# Patient Record
Sex: Female | Born: 1965 | Race: White | Hispanic: No | Smoking: Former smoker
Health system: Southern US, Community
[De-identification: ages and names within clinical notes are randomized; demographics above are authoritative.]

## PROBLEM LIST (undated history)

## (undated) DIAGNOSIS — K219 Gastro-esophageal reflux disease without esophagitis: Secondary | ICD-10-CM

## (undated) DIAGNOSIS — I10 Essential (primary) hypertension: Secondary | ICD-10-CM

## (undated) DIAGNOSIS — M1990 Unspecified osteoarthritis, unspecified site: Secondary | ICD-10-CM

## (undated) DIAGNOSIS — M5430 Sciatica, unspecified side: Secondary | ICD-10-CM

## (undated) DIAGNOSIS — R51 Headache: Secondary | ICD-10-CM

## (undated) DIAGNOSIS — E669 Obesity, unspecified: Secondary | ICD-10-CM

## (undated) HISTORY — PX: CHOLECYSTECTOMY: SHX55

## (undated) HISTORY — PX: EYE SURGERY: SHX253

---

## 1992-12-27 HISTORY — PX: TUBAL LIGATION: SHX77

## 2005-12-24 ENCOUNTER — Emergency Department (HOSPITAL_COMMUNITY): Admission: EM | Admit: 2005-12-24 | Discharge: 2005-12-24 | Payer: Self-pay | Admitting: Emergency Medicine

## 2006-05-15 ENCOUNTER — Emergency Department (HOSPITAL_COMMUNITY): Admission: EM | Admit: 2006-05-15 | Discharge: 2006-05-15 | Payer: Self-pay | Admitting: Emergency Medicine

## 2010-05-08 ENCOUNTER — Emergency Department (HOSPITAL_BASED_OUTPATIENT_CLINIC_OR_DEPARTMENT_OTHER): Admission: EM | Admit: 2010-05-08 | Discharge: 2010-05-09 | Payer: Self-pay | Admitting: Emergency Medicine

## 2011-03-16 LAB — URINE MICROSCOPIC-ADD ON

## 2011-03-16 LAB — GLUCOSE, CAPILLARY
Glucose-Capillary: 330 mg/dL — ABNORMAL HIGH (ref 70–99)
Glucose-Capillary: 372 mg/dL — ABNORMAL HIGH (ref 70–99)

## 2011-03-16 LAB — PREGNANCY, URINE: Preg Test, Ur: NEGATIVE

## 2011-03-16 LAB — BASIC METABOLIC PANEL
BUN: 10 mg/dL (ref 6–23)
CO2: 26 mEq/L (ref 19–32)
Chloride: 99 mEq/L (ref 96–112)
Creatinine, Ser: 0.6 mg/dL (ref 0.4–1.2)
Glucose, Bld: 440 mg/dL — ABNORMAL HIGH (ref 70–99)

## 2011-03-16 LAB — DIFFERENTIAL
Basophils Absolute: 0.1 10*3/uL (ref 0.0–0.1)
Basophils Relative: 1 % (ref 0–1)
Eosinophils Absolute: 0.1 10*3/uL (ref 0.0–0.7)
Eosinophils Relative: 1 % (ref 0–5)
Neutrophils Relative %: 77 % (ref 43–77)

## 2011-03-16 LAB — URINALYSIS, ROUTINE W REFLEX MICROSCOPIC
Bilirubin Urine: NEGATIVE
Hgb urine dipstick: NEGATIVE
Nitrite: NEGATIVE
Specific Gravity, Urine: 1.027 (ref 1.005–1.030)
pH: 7.5 (ref 5.0–8.0)

## 2011-03-16 LAB — CBC
HCT: 41.5 % (ref 36.0–46.0)
MCHC: 33 g/dL (ref 30.0–36.0)
MCV: 89.3 fL (ref 78.0–100.0)
Platelets: 197 10*3/uL (ref 150–400)
RDW: 14.4 % (ref 11.5–15.5)
WBC: 12.5 10*3/uL — ABNORMAL HIGH (ref 4.0–10.5)

## 2011-03-16 LAB — KETONES, QUALITATIVE: Acetone, Bld: NEGATIVE

## 2012-07-14 ENCOUNTER — Encounter (HOSPITAL_BASED_OUTPATIENT_CLINIC_OR_DEPARTMENT_OTHER): Payer: Self-pay | Admitting: *Deleted

## 2012-07-14 ENCOUNTER — Emergency Department (HOSPITAL_BASED_OUTPATIENT_CLINIC_OR_DEPARTMENT_OTHER)
Admission: EM | Admit: 2012-07-14 | Discharge: 2012-07-14 | Disposition: A | Discharge status: Home / Self Care | Payer: Medicaid Other | Attending: Emergency Medicine | Admitting: Emergency Medicine

## 2012-07-14 ENCOUNTER — Emergency Department (HOSPITAL_BASED_OUTPATIENT_CLINIC_OR_DEPARTMENT_OTHER): Payer: Medicaid Other

## 2012-07-14 DIAGNOSIS — W2209XA Striking against other stationary object, initial encounter: Secondary | ICD-10-CM | POA: Insufficient documentation

## 2012-07-14 DIAGNOSIS — I10 Essential (primary) hypertension: Secondary | ICD-10-CM | POA: Insufficient documentation

## 2012-07-14 DIAGNOSIS — E119 Type 2 diabetes mellitus without complications: Secondary | ICD-10-CM | POA: Insufficient documentation

## 2012-07-14 DIAGNOSIS — S63509A Unspecified sprain of unspecified wrist, initial encounter: Secondary | ICD-10-CM | POA: Insufficient documentation

## 2012-07-14 HISTORY — DX: Essential (primary) hypertension: I10

## 2012-07-14 MED ORDER — HYDROCODONE-ACETAMINOPHEN 5-500 MG PO TABS: 1.0000 | ORAL_TABLET | Freq: Four times a day (QID) | ORAL | Status: AC | PRN

## 2012-07-14 NOTE — ED Provider Notes (Signed)
History/physical exam/procedure(s) were performed by non-physician practitioner and as supervising physician I was immediately available for consultation/collaboration. I have reviewed all notes and am in agreement with care and plan.   Karmin Kasprzak S Almarosa Bohac, MD 07/14/12 2314 

## 2012-07-14 NOTE — ED Notes (Signed)
Yesterday she hit her left wrist on wooden rail to catch herself from falling.

## 2012-07-14 NOTE — ED Provider Notes (Signed)
History     CSN: [id number]  Arrival date & time 07/14/12  2009   First MD Initiated Contact with Patient 07/14/12 2134      Chief Complaint  Patient presents with  . Wrist Pain    (Consider location/radiation/quality/duration/timing/severity/associated sxs/prior treatment) HPI Comments: Pt states that she was falling yesterday and she hit her wrist on the rail to catch herself and she is continuing to have pain  Patient is a 46 y.o. female presenting with wrist pain. The history is provided by the patient. No language interpreter was used.  Wrist Pain This is a new problem. The current episode started yesterday. The problem occurs constantly. The problem has been unchanged. The symptoms are aggravated by bending. She has tried nothing for the symptoms.    Past Medical History  Diagnosis Date  . Hypertension   . Diabetes mellitus     Past Surgical History  Procedure Date  . Cholecystectomy     No family history on file.  History  Substance Use Topics  . Smoking status: Never Smoker   . Smokeless tobacco: Not on file  . Alcohol Use: No    OB History    Grav Para Term Preterm Abortions TAB SAB Ect Mult Living                  Review of Systems  Constitutional: Negative.   Respiratory: Negative.   Cardiovascular: Negative.     Allergies  Sulfa antibiotics  Home Medications   Current Outpatient Rx  Name Route Sig Dispense Refill  . IBUPROFEN 200 MG PO TABS Oral Take 800 mg by mouth every 6 (six) hours as needed. For pain.    Marland Kitchen LISINOPRIL-HYDROCHLOROTHIAZIDE 20-12.5 MG PO TABS Oral Take 1 tablet by mouth daily.    Marland Kitchen METFORMIN HCL 1000 MG PO TABS Oral Take 1,000 mg by mouth 2 (two) times daily with a meal.    . OMEPRAZOLE 10 MG PO CPDR Oral Take 10 mg by mouth daily.    Marland Kitchen HYDROCODONE-ACETAMINOPHEN 5-500 MG PO TABS Oral Take 1-2 tablets by mouth every 6 (six) hours as needed for pain. 10 tablet 0    BP 129/86  Pulse 94  Temp 98 F (36.7 C) (Oral)  Resp  20  SpO2 99%  LMP 07/06/2012  Physical Exam  Nursing note and vitals reviewed. Constitutional: She is oriented to person, place, and time. She appears well-developed and well-nourished.  Cardiovascular: Normal rate and regular rhythm.   Pulmonary/Chest: Breath sounds normal.  Musculoskeletal: Normal range of motion.       Pt has generalized swelling to the left wrist:pt has full WUJ:WJXBJY intact  Neurological: She is alert and oriented to person, place, and time. Coordination normal.  Skin: Skin is warm and dry.    ED Course  Procedures (including critical care time)  Labs Reviewed - No data to display Dg Wrist Complete Left  07/14/2012  *RADIOLOGY REPORT*  Clinical Data: History of fall complaining of left wrist pain and swelling.  LEFT WRIST - COMPLETE 3+ VIEW  Comparison: No priors.  Findings: Four views of the left wrist demonstrate no acute fracture, subluxation or dislocation.  Extensive soft tissue swelling is noted around the wrist joint.  IMPRESSION: 1.  Soft tissue swelling around the left wrist joint, without evidence of underlying bony trauma.  Original Report Authenticated By: Florencia Reasons, M.D.     1. Wrist sprain       MDM  Pt placed in a sling  for comfort and given vicodin for pain        Teressa Lower, NP 07/14/12 2156

## 2012-08-18 ENCOUNTER — Encounter (HOSPITAL_BASED_OUTPATIENT_CLINIC_OR_DEPARTMENT_OTHER): Payer: Self-pay | Admitting: Emergency Medicine

## 2012-08-18 ENCOUNTER — Emergency Department (HOSPITAL_BASED_OUTPATIENT_CLINIC_OR_DEPARTMENT_OTHER)
Admission: EM | Admit: 2012-08-18 | Discharge: 2012-08-18 | Disposition: A | Discharge status: Home / Self Care | Payer: 59 | Attending: Emergency Medicine | Admitting: Emergency Medicine

## 2012-08-18 ENCOUNTER — Emergency Department (HOSPITAL_BASED_OUTPATIENT_CLINIC_OR_DEPARTMENT_OTHER): Payer: 59

## 2012-08-18 ENCOUNTER — Other Ambulatory Visit (HOSPITAL_BASED_OUTPATIENT_CLINIC_OR_DEPARTMENT_OTHER): Payer: Medicaid Other

## 2012-08-18 DIAGNOSIS — N739 Female pelvic inflammatory disease, unspecified: Secondary | ICD-10-CM | POA: Insufficient documentation

## 2012-08-18 DIAGNOSIS — Z882 Allergy status to sulfonamides status: Secondary | ICD-10-CM | POA: Insufficient documentation

## 2012-08-18 DIAGNOSIS — N760 Acute vaginitis: Secondary | ICD-10-CM | POA: Insufficient documentation

## 2012-08-18 DIAGNOSIS — A499 Bacterial infection, unspecified: Secondary | ICD-10-CM | POA: Insufficient documentation

## 2012-08-18 DIAGNOSIS — I10 Essential (primary) hypertension: Secondary | ICD-10-CM | POA: Insufficient documentation

## 2012-08-18 DIAGNOSIS — E669 Obesity, unspecified: Secondary | ICD-10-CM | POA: Insufficient documentation

## 2012-08-18 DIAGNOSIS — B9689 Other specified bacterial agents as the cause of diseases classified elsewhere: Secondary | ICD-10-CM | POA: Insufficient documentation

## 2012-08-18 DIAGNOSIS — K219 Gastro-esophageal reflux disease without esophagitis: Secondary | ICD-10-CM | POA: Insufficient documentation

## 2012-08-18 DIAGNOSIS — E119 Type 2 diabetes mellitus without complications: Secondary | ICD-10-CM | POA: Insufficient documentation

## 2012-08-18 HISTORY — DX: Obesity, unspecified: E66.9

## 2012-08-18 HISTORY — DX: Gastro-esophageal reflux disease without esophagitis: K21.9

## 2012-08-18 LAB — URINALYSIS, ROUTINE W REFLEX MICROSCOPIC
Glucose, UA: >1000 mg/dL — AB
Hgb urine dipstick: NEGATIVE
Ketones, ur: NEGATIVE mg/dL
Leukocytes, UA: NEGATIVE
Protein, ur: NEGATIVE mg/dL
pH: 5.5 (ref 5.0–8.0)

## 2012-08-18 LAB — WET PREP, GENITAL
Trich, Wet Prep: NONE SEEN
Yeast Wet Prep HPF POC: NONE SEEN

## 2012-08-18 MED ORDER — LIDOCAINE HCL (PF) 1 % IJ SOLN
INTRAMUSCULAR | Status: AC
  Administered 2012-08-18: 5 mL
  Filled 2012-08-18: qty 5

## 2012-08-18 MED ORDER — METRONIDAZOLE 500 MG PO TABS
2000.0000 mg | ORAL_TABLET | Freq: Once | ORAL | Status: AC
  Administered 2012-08-18: 2000 mg via ORAL
  Filled 2012-08-18: qty 4

## 2012-08-18 MED ORDER — DOXYCYCLINE HYCLATE 100 MG PO TABS
100.0000 mg | ORAL_TABLET | Freq: Once | ORAL | Status: AC
  Administered 2012-08-18: 100 mg via ORAL
  Filled 2012-08-18: qty 1

## 2012-08-18 MED ORDER — DOXYCYCLINE HYCLATE 100 MG PO CAPS: 100.0000 mg | ORAL_CAPSULE | Freq: Two times a day (BID) | ORAL | Status: AC

## 2012-08-18 MED ORDER — CEFTRIAXONE SODIUM 250 MG IJ SOLR
250.0000 mg | Freq: Once | INTRAMUSCULAR | Status: AC
  Administered 2012-08-18: 250 mg via INTRAMUSCULAR
  Filled 2012-08-18: qty 250

## 2012-08-18 NOTE — ED Provider Notes (Signed)
History     CSN: [id number]  Arrival date & time 08/18/12  1916   First MD Initiated Contact with Patient 08/18/12 2024      Chief Complaint  Patient presents with  . Vaginal Discharge  . Vaginal Itching    (Consider location/radiation/quality/duration/timing/severity/associated sxs/prior treatment) HPI Comments: Patient reports 3 weeks of white vaginal discharge unrelieved by Monistat. She complains of itching with urination and vaginal pain. She denies any nausea, vomiting, fevers or chills. She complains of intermittent "ovary pain" over the past year it comes and goes. She denies any back pain, chest pain shortness of breath. She denies any vaginal bleeding.  The history is provided by the patient.    Past Medical History  Diagnosis Date  . Hypertension   . Diabetes mellitus   . GERD (gastroesophageal reflux disease)   . Obesity     Past Surgical History  Procedure Date  . Cholecystectomy     No family history on file.  History  Substance Use Topics  . Smoking status: Never Smoker   . Smokeless tobacco: Not on file  . Alcohol Use: No    OB History    Grav Para Term Preterm Abortions TAB SAB Ect Mult Living                  Review of Systems  Constitutional: Negative for fever, activity change and appetite change.  HENT: Negative for congestion and rhinorrhea.   Respiratory: Negative for cough, chest tightness and shortness of breath.   Cardiovascular: Negative for chest pain.  Gastrointestinal: Positive for abdominal pain. Negative for nausea and vomiting.  Genitourinary: Positive for vaginal discharge. Negative for dysuria, hematuria and vaginal bleeding.  Musculoskeletal: Negative for back pain.  Skin: Negative for wound.  Neurological: Negative for dizziness, tremors, weakness and headaches.    Allergies  Sulfa antibiotics  Home Medications   Current Outpatient Rx  Name Route Sig Dispense Refill  . ALBUTEROL SULFATE HFA 108 (90 BASE) MCG/ACT  IN AERS Inhalation Inhale 2 puffs into the lungs every 6 (six) hours as needed. For shortness of breath    . DIPHENHYDRAMINE-APAP (SLEEP) 25-500 MG PO TABS Oral Take 2 tablets by mouth at bedtime as needed. For pain    . LISINOPRIL-HYDROCHLOROTHIAZIDE 20-12.5 MG PO TABS Oral Take 1 tablet by mouth daily.    Marland Kitchen METFORMIN HCL 1000 MG PO TABS Oral Take 1,000 mg by mouth 2 (two) times daily with a meal.    . OMEPRAZOLE 10 MG PO CPDR Oral Take 10 mg by mouth daily.    Marland Kitchen DOXYCYCLINE HYCLATE 100 MG PO CAPS Oral Take 1 capsule (100 mg total) by mouth 2 (two) times daily. 20 capsule 0    BP 146/100  Pulse 78  Temp 98 F (36.7 C) (Oral)  Resp 18  Ht 5\' 5"  (1.651 m)  Wt 224 lb (101.606 kg)  BMI 37.28 kg/m2  SpO2 99%  LMP 07/31/2012  Physical Exam  Constitutional: She is oriented to person, place, and time. She appears well-developed and well-nourished. No distress.  HENT:  Head: Normocephalic and atraumatic.  Mouth/Throat: Oropharynx is clear and moist. No oropharyngeal exudate.  Eyes: Conjunctivae and EOM are normal. Pupils are equal, round, and reactive to light.  Neck: Normal range of motion. Neck supple.  Cardiovascular: Normal rate, regular rhythm and normal heart sounds.   No murmur heard. Pulmonary/Chest: Effort normal and breath sounds normal. No respiratory distress.  Abdominal: Soft. There is tenderness. There is no rebound and  no guarding.       Mild suprapubic tenderness  Genitourinary: Cervix exhibits motion tenderness and discharge. Right adnexum displays no mass and no tenderness. Left adnexum displays no mass and no tenderness. Vaginal discharge found.       Mild CMT and bilateral adnexal tenderness. White discharge with cervix.  Musculoskeletal: Normal range of motion. She exhibits no edema and no tenderness.  Neurological: She is alert and oriented to person, place, and time. No cranial nerve deficit.  Skin: Skin is warm.    ED Course  Procedures (including critical care  time)  Labs Reviewed  URINALYSIS, ROUTINE W REFLEX MICROSCOPIC - Abnormal; Notable for the following:    Specific Gravity, Urine 1.043 (*)     Glucose, UA >1000 (*)     Bilirubin Urine SMALL (*)     All other components within normal limits  WET PREP, GENITAL - Abnormal; Notable for the following:    Clue Cells Wet Prep HPF POC FEW (*)     WBC, Wet Prep HPF POC FEW (*)     All other components within normal limits  URINE MICROSCOPIC-ADD ON  GC/CHLAMYDIA PROBE AMP, GENITAL   No results found.   1. Pelvic inflammatory disease   2. Bacterial vaginosis       MDM  Vaginal discharge for 3 weeks with lower abdominal pain. No vomiting or fever  Mild suprapubic and exam. No lateralizing adnexal tenderness. No yeast visualized. Given cervical motion tenderness treated empirically for PID. Treated for bacterial vaginosis as well.  Plan to pursue ultrasound given patient's complaint of "ovary pain", but she did not want to wait. Doubt ovarian torsion or TOA. She is not having any pain currently and states she'll followup with her doctor for this.      Glynn Octave, MD 08/18/12 2150

## 2012-08-18 NOTE — ED Notes (Signed)
Pt reports white vaginal discharge x3 weeks.  Has used OTC Monistat more than once with no improvement in symptoms.  Also pt is having bil "ovary" pain that comes and goes.  Has been seen at [facility] for it before but it "keeps coming back."  Sts she is not able to sleep due to the pain.

## 2012-08-18 NOTE — ED Notes (Signed)
Pt c/o itching really bad.Has been seen by MD in [place]

## 2012-08-19 LAB — GC/CHLAMYDIA PROBE AMP, GENITAL
Chlamydia, DNA Probe: NEGATIVE
GC Probe Amp, Genital: NEGATIVE

## 2012-11-04 ENCOUNTER — Encounter (HOSPITAL_BASED_OUTPATIENT_CLINIC_OR_DEPARTMENT_OTHER): Payer: Self-pay

## 2012-11-04 ENCOUNTER — Emergency Department (HOSPITAL_BASED_OUTPATIENT_CLINIC_OR_DEPARTMENT_OTHER)
Admission: EM | Admit: 2012-11-04 | Discharge: 2012-11-04 | Disposition: A | Discharge status: Home / Self Care | Payer: Medicaid Other | Attending: Emergency Medicine | Admitting: Emergency Medicine

## 2012-11-04 DIAGNOSIS — R1110 Vomiting, unspecified: Secondary | ICD-10-CM

## 2012-11-04 DIAGNOSIS — H53149 Visual discomfort, unspecified: Secondary | ICD-10-CM | POA: Insufficient documentation

## 2012-11-04 DIAGNOSIS — R112 Nausea with vomiting, unspecified: Secondary | ICD-10-CM | POA: Insufficient documentation

## 2012-11-04 DIAGNOSIS — R51 Headache: Secondary | ICD-10-CM | POA: Insufficient documentation

## 2012-11-04 DIAGNOSIS — R739 Hyperglycemia, unspecified: Secondary | ICD-10-CM

## 2012-11-04 DIAGNOSIS — E669 Obesity, unspecified: Secondary | ICD-10-CM | POA: Insufficient documentation

## 2012-11-04 DIAGNOSIS — K219 Gastro-esophageal reflux disease without esophagitis: Secondary | ICD-10-CM | POA: Insufficient documentation

## 2012-11-04 DIAGNOSIS — I10 Essential (primary) hypertension: Secondary | ICD-10-CM | POA: Insufficient documentation

## 2012-11-04 DIAGNOSIS — E860 Dehydration: Secondary | ICD-10-CM | POA: Insufficient documentation

## 2012-11-04 DIAGNOSIS — E1169 Type 2 diabetes mellitus with other specified complication: Secondary | ICD-10-CM | POA: Insufficient documentation

## 2012-11-04 LAB — URINALYSIS, ROUTINE W REFLEX MICROSCOPIC
Glucose, UA: >1000 mg/dL — AB
Hgb urine dipstick: NEGATIVE
Leukocytes, UA: NEGATIVE
Protein, ur: 100 mg/dL — AB
Specific Gravity, Urine: 1.039 — ABNORMAL HIGH (ref 1.005–1.030)
Urobilinogen, UA: 0.2 mg/dL (ref 0.0–1.0)

## 2012-11-04 LAB — GLUCOSE, CAPILLARY: Glucose-Capillary: 256 mg/dL — ABNORMAL HIGH (ref 70–99)

## 2012-11-04 LAB — COMPREHENSIVE METABOLIC PANEL
ALT: 12 U/L (ref 0–35)
BUN: 10 mg/dL (ref 6–23)
CO2: 25 mEq/L (ref 19–32)
Calcium: 9.8 mg/dL (ref 8.4–10.5)
Creatinine, Ser: 0.6 mg/dL (ref 0.50–1.10)
GFR calc Af Amer: >90 mL/min (ref >90)
GFR calc non Af Amer: >90 mL/min (ref >90)
Glucose, Bld: 284 mg/dL — ABNORMAL HIGH (ref 70–99)
Sodium: 134 mEq/L — ABNORMAL LOW (ref 135–145)
Total Protein: 7.7 g/dL (ref 6.0–8.3)

## 2012-11-04 LAB — LIPASE, BLOOD: Lipase: 27 U/L (ref 11–59)

## 2012-11-04 LAB — CBC WITH DIFFERENTIAL/PLATELET
Eosinophils Absolute: 0 10*3/uL (ref 0.0–0.7)
Eosinophils Relative: 0 % (ref 0–5)
HCT: 41 % (ref 36.0–46.0)
Hemoglobin: 13.9 g/dL (ref 12.0–15.0)
Lymphocytes Relative: 18 % (ref 12–46)
Lymphs Abs: 1.8 10*3/uL (ref 0.7–4.0)
MCH: 28.6 pg (ref 26.0–34.0)
MCV: 84.4 fL (ref 78.0–100.0)
Monocytes Absolute: 0.7 10*3/uL (ref 0.1–1.0)
Monocytes Relative: 7 % (ref 3–12)
Platelets: 213 10*3/uL (ref 150–400)
RBC: 4.86 MIL/uL (ref 3.87–5.11)
WBC: 9.9 10*3/uL (ref 4.0–10.5)

## 2012-11-04 LAB — PREGNANCY, URINE: Preg Test, Ur: NEGATIVE

## 2012-11-04 LAB — URINE MICROSCOPIC-ADD ON

## 2012-11-04 MED ORDER — HYDROMORPHONE HCL PF 1 MG/ML IJ SOLN
1.0000 mg | Freq: Once | INTRAMUSCULAR | Status: AC
  Administered 2012-11-04: 1 mg via INTRAVENOUS
  Filled 2012-11-04: qty 1

## 2012-11-04 MED ORDER — METOCLOPRAMIDE HCL 5 MG/ML IJ SOLN
INTRAMUSCULAR | Status: AC
  Administered 2012-11-04: 10 mg via INTRAVENOUS
  Filled 2012-11-04: qty 2

## 2012-11-04 MED ORDER — METOCLOPRAMIDE HCL 5 MG/ML IJ SOLN
10.0000 mg | Freq: Once | INTRAMUSCULAR | Status: AC
  Administered 2012-11-04: 10 mg via INTRAVENOUS

## 2012-11-04 MED ORDER — ONDANSETRON HCL 4 MG/2ML IJ SOLN
4.0000 mg | Freq: Once | INTRAMUSCULAR | Status: AC
  Administered 2012-11-04: 4 mg via INTRAVENOUS
  Filled 2012-11-04: qty 2

## 2012-11-04 MED ORDER — SODIUM CHLORIDE 0.9 % IV BOLUS (SEPSIS)
1000.0000 mL | Freq: Once | INTRAVENOUS | Status: AC
  Administered 2012-11-04: 1000 mL via INTRAVENOUS

## 2012-11-04 MED ORDER — METOCLOPRAMIDE HCL 10 MG PO TABS: 10.0000 mg | ORAL_TABLET | Freq: Four times a day (QID) | ORAL | Status: DC | PRN

## 2012-11-04 NOTE — ED Notes (Signed)
CBG 256  °

## 2012-11-04 NOTE — ED Provider Notes (Signed)
History     CSN: [id number]  Arrival date & time 11/04/12  1900   First MD Initiated Contact with Patient 11/04/12 2013      Chief Complaint  Patient presents with  . headache, nausea     (Consider location/radiation/quality/duration/timing/severity/associated sxs/prior treatment) HPI Pt with history of DM reports onset of nausea and vomiting yesterday afternoon, multiple episodes without blood. A few hours after onset she began to have diffuse throbbing, frontal headache, associated with photophobia and continued vomiting. Denies any abdominal pain, no fever. Has had general malaise.   Past Medical History  Diagnosis Date  . Hypertension   . Diabetes mellitus   . GERD (gastroesophageal reflux disease)   . Obesity     Past Surgical History  Procedure Date  . Cholecystectomy     No family history on file.  History  Substance Use Topics  . Smoking status: Never Smoker   . Smokeless tobacco: Not on file  . Alcohol Use: No    OB History    Grav Para Term Preterm Abortions TAB SAB Ect Mult Living                  Review of Systems All other systems reviewed and are negative except as noted in HPI.   Allergies  Sulfa antibiotics  Home Medications   Current Outpatient Rx  Name  Route  Sig  Dispense  Refill  . ALBUTEROL SULFATE HFA 108 (90 BASE) MCG/ACT IN AERS   Inhalation   Inhale 2 puffs into the lungs every 6 (six) hours as needed. For shortness of breath         . DIPHENHYDRAMINE-APAP (SLEEP) 25-500 MG PO TABS   Oral   Take 2 tablets by mouth at bedtime as needed. For pain         . LISINOPRIL-HYDROCHLOROTHIAZIDE 20-12.5 MG PO TABS   Oral   Take 1 tablet by mouth daily.         Marland Kitchen METFORMIN HCL 1000 MG PO TABS   Oral   Take 1,000 mg by mouth 2 (two) times daily with a meal.         . OMEPRAZOLE 10 MG PO CPDR   Oral   Take 10 mg by mouth daily.           BP 139/92  Pulse 92  Temp 98.9 F (37.2 C) (Oral)  Resp 18  Ht 5\' 5"  (1.651  m)  Wt 220 lb (99.791 kg)  BMI 36.61 kg/m2  SpO2 100%  Physical Exam  Nursing note and vitals reviewed. Constitutional: She is oriented to person, place, and time. She appears well-developed and well-nourished.  HENT:  Head: Normocephalic and atraumatic.       Dry mouth  Eyes: EOM are normal. Pupils are equal, round, and reactive to light.  Neck: Normal range of motion. Neck supple.  Cardiovascular: Normal rate, normal heart sounds and intact distal pulses.   Pulmonary/Chest: Effort normal and breath sounds normal.  Abdominal: Bowel sounds are normal. She exhibits no distension. There is no tenderness.  Musculoskeletal: Normal range of motion. She exhibits no edema and no tenderness.  Neurological: She is alert and oriented to person, place, and time. She has normal strength. No cranial nerve deficit or sensory deficit.  Skin: Skin is warm and dry. No rash noted.  Psychiatric: She has a normal mood and affect.    ED Course  Procedures (including critical care time)  Labs Reviewed  URINALYSIS, ROUTINE W REFLEX  MICROSCOPIC - Abnormal; Notable for the following:    APPearance CLOUDY (*)     Specific Gravity, Urine 1.039 (*)     Glucose, UA >1000 (*)     Ketones, ur >80 (*)     Protein, ur 100 (*)     All other components within normal limits  URINE MICROSCOPIC-ADD ON - Abnormal; Notable for the following:    Squamous Epithelial / LPF MANY (*)     Bacteria, UA MANY (*)     All other components within normal limits  GLUCOSE, CAPILLARY - Abnormal; Notable for the following:    Glucose-Capillary 256 (*)     All other components within normal limits  COMPREHENSIVE METABOLIC PANEL - Abnormal; Notable for the following:    Sodium 134 (*)     Chloride 95 (*)     Glucose, Bld 284 (*)     All other components within normal limits  PREGNANCY, URINE  CBC WITH DIFFERENTIAL  LIPASE, BLOOD   No results found.   No diagnosis found.    MDM  UA concerning for dehydration and/or  DKA, labs sent, IVF, nausea and pain meds ordered.   11:21 PM Pt feeling better, IVF done. Will give PO trial and anticipate discharge. Consider gastroparesis given history of DM. Headache likely due to dehydration/vomiting, doubt primary intracranial process.       Charles B. Bernette Mayers, MD 11/04/12 2326

## 2012-11-04 NOTE — ED Notes (Signed)
D/c with RX x 1 for reglan- family present to drive

## 2012-11-04 NOTE — ED Notes (Signed)
MD at bedside. 

## 2012-11-04 NOTE — ED Notes (Signed)
Patient complains of generalized headache that started last pm, reports nausea, positive photophobia. Alert and oriented.

## 2012-12-08 ENCOUNTER — Emergency Department (HOSPITAL_BASED_OUTPATIENT_CLINIC_OR_DEPARTMENT_OTHER)
Admission: EM | Admit: 2012-12-08 | Discharge: 2012-12-08 | Disposition: A | Discharge status: Home / Self Care | Payer: Medicaid Other | Attending: Emergency Medicine | Admitting: Emergency Medicine

## 2012-12-08 ENCOUNTER — Encounter (HOSPITAL_BASED_OUTPATIENT_CLINIC_OR_DEPARTMENT_OTHER): Payer: Self-pay | Admitting: Respiratory Therapy

## 2012-12-08 ENCOUNTER — Emergency Department (HOSPITAL_BASED_OUTPATIENT_CLINIC_OR_DEPARTMENT_OTHER): Payer: Medicaid Other

## 2012-12-08 DIAGNOSIS — Y9301 Activity, walking, marching and hiking: Secondary | ICD-10-CM | POA: Insufficient documentation

## 2012-12-08 DIAGNOSIS — IMO0001 Reserved for inherently not codable concepts without codable children: Secondary | ICD-10-CM | POA: Insufficient documentation

## 2012-12-08 DIAGNOSIS — E669 Obesity, unspecified: Secondary | ICD-10-CM | POA: Insufficient documentation

## 2012-12-08 DIAGNOSIS — M25469 Effusion, unspecified knee: Secondary | ICD-10-CM | POA: Insufficient documentation

## 2012-12-08 DIAGNOSIS — X500XXA Overexertion from strenuous movement or load, initial encounter: Secondary | ICD-10-CM | POA: Insufficient documentation

## 2012-12-08 DIAGNOSIS — Y929 Unspecified place or not applicable: Secondary | ICD-10-CM | POA: Insufficient documentation

## 2012-12-08 DIAGNOSIS — E119 Type 2 diabetes mellitus without complications: Secondary | ICD-10-CM | POA: Insufficient documentation

## 2012-12-08 DIAGNOSIS — I10 Essential (primary) hypertension: Secondary | ICD-10-CM | POA: Insufficient documentation

## 2012-12-08 DIAGNOSIS — Z87891 Personal history of nicotine dependence: Secondary | ICD-10-CM | POA: Insufficient documentation

## 2012-12-08 DIAGNOSIS — M25462 Effusion, left knee: Secondary | ICD-10-CM

## 2012-12-08 DIAGNOSIS — K219 Gastro-esophageal reflux disease without esophagitis: Secondary | ICD-10-CM | POA: Insufficient documentation

## 2012-12-08 DIAGNOSIS — Z79899 Other long term (current) drug therapy: Secondary | ICD-10-CM | POA: Insufficient documentation

## 2012-12-08 MED ORDER — OXYCODONE-ACETAMINOPHEN 5-325 MG PO TABS
2.0000 | ORAL_TABLET | Freq: Once | ORAL | Status: AC
  Administered 2012-12-08: 2 via ORAL
  Filled 2012-12-08 (×2): qty 2

## 2012-12-08 MED ORDER — OXYCODONE-ACETAMINOPHEN 5-325 MG PO TABS: 2.0000 | ORAL_TABLET | ORAL | Status: DC | PRN

## 2012-12-08 NOTE — ED Provider Notes (Signed)
Medical screening examination/treatment/procedure(s) were performed by non-physician practitioner and as supervising physician I was immediately available for consultation/collaboration.   Gwyneth Sprout, MD 12/08/12 1416

## 2012-12-08 NOTE — ED Provider Notes (Signed)
History     CSN: [id number]  Arrival date & time 12/08/12  1231   First MD Initiated Contact with Patient 12/08/12 1242      Chief Complaint  Patient presents with  . Knee Pain    left    (Consider location/radiation/quality/duration/timing/severity/associated sxs/prior treatment) Patient is a 46 y.o. female presenting with knee pain. The history is provided by the patient. No language interpreter was used.  Knee Pain This is a new problem. The current episode started today. The problem occurs constantly. The problem has been gradually worsening. Associated symptoms include joint swelling and myalgias. Associated symptoms comments: swelling. Nothing aggravates the symptoms. She has tried nothing for the symptoms. The treatment provided moderate relief.  Pt complains of swelling to her left knee.   Pt reports knee pulled and popped 3 days ago while walking  Past Medical History  Diagnosis Date  . Hypertension   . Diabetes mellitus   . GERD (gastroesophageal reflux disease)   . Obesity     Past Surgical History  Procedure Date  . Cholecystectomy     No family history on file.  History  Substance Use Topics  . Smoking status: Former Smoker    Quit date: 12/27/2009  . Smokeless tobacco: Not on file  . Alcohol Use: No    OB History    Grav Para Term Preterm Abortions TAB SAB Ect Mult Living                  Review of Systems  Musculoskeletal: Positive for myalgias and joint swelling.  All other systems reviewed and are negative.    Allergies  Sulfa antibiotics  Home Medications   Current Outpatient Rx  Name  Route  Sig  Dispense  Refill  . ALBUTEROL SULFATE HFA 108 (90 BASE) MCG/ACT IN AERS   Inhalation   Inhale 2 puffs into the lungs every 6 (six) hours as needed. For shortness of breath         . DIPHENHYDRAMINE-APAP (SLEEP) 25-500 MG PO TABS   Oral   Take 2 tablets by mouth at bedtime as needed. For pain         .  LISINOPRIL-HYDROCHLOROTHIAZIDE 20-12.5 MG PO TABS   Oral   Take 1 tablet by mouth daily.         Marland Kitchen METFORMIN HCL 1000 MG PO TABS   Oral   Take 1,000 mg by mouth 2 (two) times daily with a meal.         . METOCLOPRAMIDE HCL 10 MG PO TABS   Oral   Take 1 tablet (10 mg total) by mouth 4 (four) times daily as needed. For nausea   30 tablet   0   . OMEPRAZOLE 10 MG PO CPDR   Oral   Take 10 mg by mouth daily.           BP 122/85  Pulse 96  Temp 97.9 F (36.6 C)  Resp 18  Ht 5\' 5"  (1.651 m)  Wt 220 lb (99.791 kg)  BMI 36.61 kg/m2  SpO2 100%  LMP 12/05/2012  Physical Exam  Nursing note and vitals reviewed. Constitutional: She is oriented to person, place, and time. She appears well-developed and well-nourished.  HENT:  Head: Normocephalic and atraumatic.  Musculoskeletal: She exhibits tenderness.       Tender left knee swollen,  Pain with palpation,  nv and ns intact  Neurological: She is alert and oriented to person, place, and time. She has normal  reflexes.  Skin: Skin is warm.  Psychiatric: She has a normal mood and affect.    ED Course  Procedures (including critical care time)  Labs Reviewed - No data to display Dg Knee Complete 4 Views Left  12/08/2012  *RADIOLOGY REPORT*  Clinical Data: Pain and swelling post fall  LEFT KNEE - COMPLETE 4+ VIEW  Comparison: None  Findings: Four views of the left knee submitted.  No acute fracture or subluxation. Small joint effusion.  Narrowing of patellofemoral joint space. There is mild narrowing of medial joint compartment.  Spurring of medial femoral condyle and  medial tibial plateau.  Mild chondrocalcinosis lateral compartment.  IMPRESSION: No acute fracture or subluxation.  Small joint effusion. Degenerative changes as described above.   Original Report Authenticated By: Natasha Mead, M.D.      No diagnosis found.    MDM  Knee imbolizer and crutches.   Pt advised to see Dr. Janee Morn for evaluation of knee  Rx for  percocet        Lonia Skinner [place], [place] 12/08/12 1351

## 2012-12-08 NOTE — ED Notes (Signed)
Patient states she was getting out of bed yesterday and when she stepped down on her left leg, she felt a "pop" in the posterior left knee.  States she had pain all day yesterday, and this morning while getting out of bed, she stepped onto her left leg which caused her to have intense pain in the left knee and fell onto her left knee.  Denies any other injuries.

## 2012-12-18 ENCOUNTER — Encounter (HOSPITAL_BASED_OUTPATIENT_CLINIC_OR_DEPARTMENT_OTHER): Payer: Self-pay | Admitting: *Deleted

## 2012-12-18 ENCOUNTER — Emergency Department (HOSPITAL_BASED_OUTPATIENT_CLINIC_OR_DEPARTMENT_OTHER)
Admission: EM | Admit: 2012-12-18 | Discharge: 2012-12-18 | Disposition: A | Discharge status: Home / Self Care | Payer: Medicaid Other | Attending: Emergency Medicine | Admitting: Emergency Medicine

## 2012-12-18 DIAGNOSIS — M25569 Pain in unspecified knee: Secondary | ICD-10-CM | POA: Insufficient documentation

## 2012-12-18 DIAGNOSIS — I10 Essential (primary) hypertension: Secondary | ICD-10-CM | POA: Insufficient documentation

## 2012-12-18 DIAGNOSIS — M25562 Pain in left knee: Secondary | ICD-10-CM

## 2012-12-18 DIAGNOSIS — Z79899 Other long term (current) drug therapy: Secondary | ICD-10-CM | POA: Insufficient documentation

## 2012-12-18 DIAGNOSIS — E119 Type 2 diabetes mellitus without complications: Secondary | ICD-10-CM | POA: Insufficient documentation

## 2012-12-18 DIAGNOSIS — Z87891 Personal history of nicotine dependence: Secondary | ICD-10-CM | POA: Insufficient documentation

## 2012-12-18 DIAGNOSIS — IMO0001 Reserved for inherently not codable concepts without codable children: Secondary | ICD-10-CM | POA: Insufficient documentation

## 2012-12-18 DIAGNOSIS — E669 Obesity, unspecified: Secondary | ICD-10-CM | POA: Insufficient documentation

## 2012-12-18 DIAGNOSIS — K219 Gastro-esophageal reflux disease without esophagitis: Secondary | ICD-10-CM | POA: Insufficient documentation

## 2012-12-18 DIAGNOSIS — M25469 Effusion, unspecified knee: Secondary | ICD-10-CM | POA: Insufficient documentation

## 2012-12-18 MED ORDER — OXYCODONE-ACETAMINOPHEN 5-325 MG PO TABS: 2.0000 | ORAL_TABLET | ORAL | Status: DC | PRN

## 2012-12-18 NOTE — ED Provider Notes (Signed)
Medical screening examination/treatment/procedure(s) were performed by non-physician practitioner and as supervising physician I was immediately available for consultation/collaboration.  Jhovanny Guinta, MD 12/18/12 2240 

## 2012-12-18 NOTE — ED Provider Notes (Signed)
History     CSN: [id number]  Arrival date & time 12/18/12  1144   First MD Initiated Contact with Patient 12/18/12 1202      Chief Complaint  Patient presents with  . Knee Pain    (Consider location/radiation/quality/duration/timing/severity/associated sxs/prior treatment) Patient is a 46 y.o. female presenting with knee pain. The history is provided by the patient.  Knee Pain This is a new problem. The current episode started 1 to 4 weeks ago. The problem occurs constantly. The problem has been gradually worsening. Associated symptoms include joint swelling and myalgias. Associated symptoms comments: swelling. The symptoms are aggravated by bending. She has tried nothing for the symptoms. The treatment provided no relief.  Pt seen here by me on 12/13 for knee injury with effusion.   Pt can not see Orthopaedist until next week.   Pt still swollen and painful.   Pt is requesting a knee sleeve  Past Medical History  Diagnosis Date  . Hypertension   . Diabetes mellitus   . GERD (gastroesophageal reflux disease)   . Obesity     Past Surgical History  Procedure Date  . Cholecystectomy     No family history on file.  History  Substance Use Topics  . Smoking status: Former Smoker    Quit date: 12/27/2009  . Smokeless tobacco: Not on file  . Alcohol Use: No    OB History    Grav Para Term Preterm Abortions TAB SAB Ect Mult Living                  Review of Systems  Musculoskeletal: Positive for myalgias and joint swelling.  All other systems reviewed and are negative.    Allergies  Sulfa antibiotics  Home Medications   Current Outpatient Rx  Name  Route  Sig  Dispense  Refill  . ALBUTEROL SULFATE HFA 108 (90 BASE) MCG/ACT IN AERS   Inhalation   Inhale 2 puffs into the lungs every 6 (six) hours as needed. For shortness of breath         . DIPHENHYDRAMINE-APAP (SLEEP) 25-500 MG PO TABS   Oral   Take 2 tablets by mouth at bedtime as needed. For pain          . LISINOPRIL-HYDROCHLOROTHIAZIDE 20-12.5 MG PO TABS   Oral   Take 1 tablet by mouth daily.         Marland Kitchen METFORMIN HCL 1000 MG PO TABS   Oral   Take 1,000 mg by mouth 2 (two) times daily with a meal.         . METOCLOPRAMIDE HCL 10 MG PO TABS   Oral   Take 1 tablet (10 mg total) by mouth 4 (four) times daily as needed. For nausea   30 tablet   0   . OMEPRAZOLE 10 MG PO CPDR   Oral   Take 10 mg by mouth daily.         . OXYCODONE-ACETAMINOPHEN 5-325 MG PO TABS   Oral   Take 2 tablets by mouth every 4 (four) hours as needed for pain.   16 tablet   0     BP 113/79  Pulse 101  Temp 98.6 F (37 C) (Oral)  Resp 18  Ht 5\' 5"  (1.651 m)  Wt 220 lb (99.791 kg)  BMI 36.61 kg/m2  SpO2 96%  LMP 12/05/2012  Physical Exam  Nursing note and vitals reviewed. Constitutional: She appears well-developed and well-nourished.  HENT:  Head: Normocephalic.  Cardiovascular:  Normal rate.   Pulmonary/Chest: Effort normal.  Musculoskeletal: She exhibits tenderness.       Swollen tender left knee    Neurological: She is alert.  Skin: Skin is warm and dry.    ED Course  Procedures (including critical care time)  Labs Reviewed - No data to display No results found.   No diagnosis found.    MDM  Knee sleeve       Elson Areas, PA 12/18/12 1257  [address]  Lonia Skinner [place], [place] 12/18/12 2141

## 2012-12-18 NOTE — ED Notes (Signed)
Left knee pain. Was here on December 13th for same. Has an appointment to see an ortho specialist in January. Here for pain medication.

## 2013-03-29 ENCOUNTER — Emergency Department (HOSPITAL_BASED_OUTPATIENT_CLINIC_OR_DEPARTMENT_OTHER)
Admission: EM | Admit: 2013-03-29 | Discharge: 2013-03-29 | Discharge status: Against Medical Advice | Payer: Medicaid Other | Attending: Emergency Medicine | Admitting: Emergency Medicine

## 2013-03-29 ENCOUNTER — Encounter (HOSPITAL_BASED_OUTPATIENT_CLINIC_OR_DEPARTMENT_OTHER): Payer: Self-pay | Admitting: *Deleted

## 2013-03-29 DIAGNOSIS — E669 Obesity, unspecified: Secondary | ICD-10-CM | POA: Insufficient documentation

## 2013-03-29 DIAGNOSIS — M25579 Pain in unspecified ankle and joints of unspecified foot: Secondary | ICD-10-CM | POA: Insufficient documentation

## 2013-03-29 DIAGNOSIS — E119 Type 2 diabetes mellitus without complications: Secondary | ICD-10-CM | POA: Insufficient documentation

## 2013-03-29 DIAGNOSIS — I10 Essential (primary) hypertension: Secondary | ICD-10-CM | POA: Insufficient documentation

## 2013-03-29 NOTE — ED Notes (Signed)
Pt called x2 with no response °

## 2013-03-29 NOTE — ED Notes (Signed)
Pt was seen by registration staff entering dept from outside, pt asked if she still wanted to be seen but pt declines. Pt states that she no longer needs to be seen. NAD noted.

## 2013-03-29 NOTE — ED Notes (Signed)
Have called for pt in waiting room x2 with no response.

## 2013-03-29 NOTE — ED Notes (Signed)
C/o bilateral foot pain x 2 weeks, denies injury. Pt is able to ambulate.

## 2013-07-02 ENCOUNTER — Emergency Department (HOSPITAL_BASED_OUTPATIENT_CLINIC_OR_DEPARTMENT_OTHER): Payer: Medicaid Other

## 2013-07-02 ENCOUNTER — Emergency Department (HOSPITAL_BASED_OUTPATIENT_CLINIC_OR_DEPARTMENT_OTHER)
Admission: EM | Admit: 2013-07-02 | Discharge: 2013-07-02 | Disposition: A | Discharge status: Home / Self Care | Payer: Medicaid Other | Attending: Emergency Medicine | Admitting: Emergency Medicine

## 2013-07-02 ENCOUNTER — Encounter (HOSPITAL_BASED_OUTPATIENT_CLINIC_OR_DEPARTMENT_OTHER): Payer: Self-pay | Admitting: *Deleted

## 2013-07-02 DIAGNOSIS — Z79899 Other long term (current) drug therapy: Secondary | ICD-10-CM | POA: Insufficient documentation

## 2013-07-02 DIAGNOSIS — K219 Gastro-esophageal reflux disease without esophagitis: Secondary | ICD-10-CM | POA: Insufficient documentation

## 2013-07-02 DIAGNOSIS — Y929 Unspecified place or not applicable: Secondary | ICD-10-CM | POA: Insufficient documentation

## 2013-07-02 DIAGNOSIS — Z87891 Personal history of nicotine dependence: Secondary | ICD-10-CM | POA: Insufficient documentation

## 2013-07-02 DIAGNOSIS — S63502A Unspecified sprain of left wrist, initial encounter: Secondary | ICD-10-CM

## 2013-07-02 DIAGNOSIS — S63509A Unspecified sprain of unspecified wrist, initial encounter: Secondary | ICD-10-CM | POA: Insufficient documentation

## 2013-07-02 DIAGNOSIS — W08XXXA Fall from other furniture, initial encounter: Secondary | ICD-10-CM | POA: Insufficient documentation

## 2013-07-02 DIAGNOSIS — Y9389 Activity, other specified: Secondary | ICD-10-CM | POA: Insufficient documentation

## 2013-07-02 DIAGNOSIS — E669 Obesity, unspecified: Secondary | ICD-10-CM | POA: Insufficient documentation

## 2013-07-02 DIAGNOSIS — I10 Essential (primary) hypertension: Secondary | ICD-10-CM | POA: Insufficient documentation

## 2013-07-02 DIAGNOSIS — E119 Type 2 diabetes mellitus without complications: Secondary | ICD-10-CM | POA: Insufficient documentation

## 2013-07-02 MED ORDER — IBUPROFEN 800 MG PO TABS: 800.0000 mg | ORAL_TABLET | Freq: Three times a day (TID) | ORAL | Status: DC

## 2013-07-02 NOTE — ED Provider Notes (Signed)
Medical screening examination/treatment/procedure(s) were performed by non-physician practitioner and as supervising physician I was immediately available for consultation/collaboration.  Ethelda Chick, MD 07/02/13 2001

## 2013-07-02 NOTE — ED Provider Notes (Signed)
History    CSN: [id number] Arrival date & time 07/02/13  1910  First MD Initiated Contact with Patient 07/02/13 1922     Chief Complaint  Patient presents with  . Arm Injury   (Consider location/radiation/quality/duration/timing/severity/associated sxs/prior Treatment) HPI  47 year old obese female with history of hypertension, and diabetes presents complaining of left arm injury. Patient states she was sleeping on the stool, lost balance, fell to the left side and have to extend her left arm to break her fall. She did not hit her head or loss of consciousness. She complaining of acute onset of pain to her left wrist and left shoulder. Pain was initially 8/10 but now it is a 3/10. Pain is worsening with movement. pain is nonradiating. No other specific treatment tried aside from ice pack. Incident happened about 2 hours ago. No other complaint.  Past Medical History  Diagnosis Date  . Hypertension   . Diabetes mellitus   . GERD (gastroesophageal reflux disease)   . Obesity    Past Surgical History  Procedure Laterality Date  . Cholecystectomy     No family history on file. History  Substance Use Topics  . Smoking status: Former Smoker    Quit date: 12/27/2009  . Smokeless tobacco: Not on file  . Alcohol Use: No   OB History   Grav Para Term Preterm Abortions TAB SAB Ect Mult Living                 Review of Systems  Constitutional: Negative for fever.  HENT: Negative for neck pain.   Musculoskeletal: Positive for arthralgias.  Skin: Negative for rash and wound.  Neurological: Negative for headaches.    Allergies  Sulfa antibiotics  Home Medications   Current Outpatient Rx  Name  Route  Sig  Dispense  Refill  . albuterol (PROVENTIL HFA;VENTOLIN HFA) 108 (90 BASE) MCG/ACT inhaler   Inhalation   Inhale 2 puffs into the lungs every 6 (six) hours as needed. For shortness of breath         . diphenhydramine-acetaminophen (TYLENOL PM EXTRA STRENGTH) 25-500 MG  TABS   Oral   Take 2 tablets by mouth at bedtime as needed. For pain         . lisinopril-hydrochlorothiazide (PRINZIDE,ZESTORETIC) 20-12.5 MG per tablet   Oral   Take 1 tablet by mouth daily.         . metFORMIN (GLUCOPHAGE) 1000 MG tablet   Oral   Take 1,000 mg by mouth 2 (two) times daily with a meal.         . metoCLOPramide (REGLAN) 10 MG tablet   Oral   Take 1 tablet (10 mg total) by mouth 4 (four) times daily as needed. For nausea   30 tablet   0   . omeprazole (PRILOSEC) 10 MG capsule   Oral   Take 10 mg by mouth daily.         Marland Kitchen oxyCODONE-acetaminophen (PERCOCET/ROXICET) 5-325 MG per tablet   Oral   Take 2 tablets by mouth every 4 (four) hours as needed for pain.   16 tablet   0    BP 131/86  Pulse 75  Temp(Src) 98.4 F (36.9 C) (Oral)  Resp 20  Wt 211 lb (95.709 kg)  BMI 35.11 kg/m2  SpO2 99% Physical Exam  Nursing note and vitals reviewed. Constitutional: She is oriented to person, place, and time.  Moderately obese  HENT:  Head: Normocephalic and atraumatic.  Eyes: Conjunctivae are normal.  Neck: Normal range of motion. Neck supple.  Musculoskeletal: She exhibits tenderness (L wrist: tenderness to dorsum of wrist without crepitus, deformity, swelling or bruise noted.  normal grip, normal L elbow ROM, normal L shoulder ROM.  radial pulse palpable).  Neurological: She is alert and oriented to person, place, and time.  Skin: Skin is warm. No rash noted.  Psychiatric: She has a normal mood and affect.    ED Course  Procedures (including critical care time)  7:46 PM Pt with L wrist injury. Xray neg for fx or dislocation.  Pain medication offered, pt decline.  RICE therapy discussed.  ACE wrap applied.  Ortho referral as needed.  Return precaution discussed.    Labs Reviewed - No data to display Dg Wrist Complete Left  07/02/2013   *RADIOLOGY REPORT*  Clinical Data: Pain after trauma.  LEFT WRIST - COMPLETE 3+ VIEW  Comparison: 07/14/2012   Findings: Mild joint space involving the radiocarpal joint. Scaphoid intact. No acute fracture or dislocation.  IMPRESSION: No acute osseous abnormality.   Original Report Authenticated By: Jeronimo Greaves, M.D.   1. Left wrist sprain, initial encounter     MDM  BP 131/86  Pulse 75  Temp(Src) 98.4 F (36.9 C) (Oral)  Resp 20  Wt 211 lb (95.709 kg)  BMI 35.11 kg/m2  SpO2 99%  I have reviewed nursing notes and vital signs. I personally reviewed the imaging tests through PACS system  I reviewed available ER/hospitalization records thought the EMR   Fayrene Helper, [place] 07/02/13 1949

## 2013-07-02 NOTE — ED Notes (Signed)
Left arm injury.  Larey Seat off a stool earlier today.

## 2013-08-27 ENCOUNTER — Emergency Department (HOSPITAL_BASED_OUTPATIENT_CLINIC_OR_DEPARTMENT_OTHER)
Admission: EM | Admit: 2013-08-27 | Discharge: 2013-08-27 | Disposition: A | Discharge status: Home / Self Care | Payer: Medicaid Other | Attending: Emergency Medicine | Admitting: Emergency Medicine

## 2013-08-27 ENCOUNTER — Encounter (HOSPITAL_BASED_OUTPATIENT_CLINIC_OR_DEPARTMENT_OTHER): Payer: Self-pay | Admitting: *Deleted

## 2013-08-27 DIAGNOSIS — R51 Headache: Secondary | ICD-10-CM | POA: Insufficient documentation

## 2013-08-27 DIAGNOSIS — E669 Obesity, unspecified: Secondary | ICD-10-CM | POA: Insufficient documentation

## 2013-08-27 DIAGNOSIS — K219 Gastro-esophageal reflux disease without esophagitis: Secondary | ICD-10-CM | POA: Insufficient documentation

## 2013-08-27 DIAGNOSIS — Y929 Unspecified place or not applicable: Secondary | ICD-10-CM | POA: Insufficient documentation

## 2013-08-27 DIAGNOSIS — I10 Essential (primary) hypertension: Secondary | ICD-10-CM | POA: Insufficient documentation

## 2013-08-27 DIAGNOSIS — E119 Type 2 diabetes mellitus without complications: Secondary | ICD-10-CM | POA: Insufficient documentation

## 2013-08-27 DIAGNOSIS — Z9849 Cataract extraction status, unspecified eye: Secondary | ICD-10-CM | POA: Insufficient documentation

## 2013-08-27 DIAGNOSIS — Z87891 Personal history of nicotine dependence: Secondary | ICD-10-CM | POA: Insufficient documentation

## 2013-08-27 DIAGNOSIS — Y939 Activity, unspecified: Secondary | ICD-10-CM | POA: Insufficient documentation

## 2013-08-27 DIAGNOSIS — X58XXXA Exposure to other specified factors, initial encounter: Secondary | ICD-10-CM | POA: Insufficient documentation

## 2013-08-27 DIAGNOSIS — Z79899 Other long term (current) drug therapy: Secondary | ICD-10-CM | POA: Insufficient documentation

## 2013-08-27 DIAGNOSIS — S058X9A Other injuries of unspecified eye and orbit, initial encounter: Secondary | ICD-10-CM | POA: Insufficient documentation

## 2013-08-27 DIAGNOSIS — S0502XA Injury of conjunctiva and corneal abrasion without foreign body, left eye, initial encounter: Secondary | ICD-10-CM

## 2013-08-27 MED ORDER — HYDROCODONE-ACETAMINOPHEN 5-325 MG PO TABS
1.0000 | ORAL_TABLET | Freq: Once | ORAL | Status: AC
  Administered 2013-08-27: 1 via ORAL
  Filled 2013-08-27: qty 1

## 2013-08-27 MED ORDER — FLUORESCEIN SODIUM 1 MG OP STRP
1.0000 | ORAL_STRIP | Freq: Once | OPHTHALMIC | Status: AC
  Administered 2013-08-27: 1 via OPHTHALMIC
  Filled 2013-08-27: qty 1

## 2013-08-27 MED ORDER — TETRACAINE HCL 0.5 % OP SOLN
1.0000 [drp] | Freq: Once | OPHTHALMIC | Status: AC
  Administered 2013-08-27: 1 [drp] via OPHTHALMIC
  Filled 2013-08-27: qty 2

## 2013-08-27 MED ORDER — HYDROCODONE-ACETAMINOPHEN 5-325 MG PO TABS: 1.0000 | ORAL_TABLET | ORAL | Status: DC | PRN

## 2013-08-27 NOTE — ED Provider Notes (Signed)
L eye pain - ongoing - several days worth.  On exam has clear conj, pupils reactive without asymetry and no consensual pain.  Corneal exam per PA note. Pain improved with tetracaine. - stable to f/u with optho.    Medical screening examination/treatment/procedure(s) were conducted as a shared visit with non-physician practitioner(s) and myself.  I personally evaluated the patient during the encounter.  Clinical Impression: L eye pain     Vida Roller, MD 08/27/13 2243

## 2013-08-27 NOTE — ED Provider Notes (Signed)
CSN: [id number]     Arrival date & time 08/27/13  1804 History   First MD Initiated Contact with Patient 08/27/13 1822     Chief Complaint  Patient presents with  . Eye Pain   (Consider location/radiation/quality/duration/timing/severity/associated sxs/prior Treatment) HPI Comments: Patient returns today with left eye pain - she reports continued pain in the eye since the surgery for cataract, states that she has seen her opthamologist for this and he reports this is "normal".  She reports normal vision but that the pain is behind her eye and is causeing her to have headaches.  She denies fever, chills, redness to the eye, the sensation of floaters, nausea, or vomiting.  She reports her sugars have been under control.   Patient is a 47 y.o. female presenting with eye pain. The history is provided by the patient. No language interpreter was used.  Eye Pain This is a recurrent problem. The current episode started today. The problem occurs constantly. The problem has been gradually worsening. Associated symptoms include headaches. Pertinent negatives include no anorexia, chest pain, chills, coughing, diaphoresis, fatigue, fever, myalgias, nausea, neck pain, numbness, sore throat, vertigo, visual change, vomiting or weakness. Nothing aggravates the symptoms. She has tried nothing for the symptoms. The treatment provided no relief.    Past Medical History  Diagnosis Date  . Hypertension   . Diabetes mellitus   . GERD (gastroesophageal reflux disease)   . Obesity    Past Surgical History  Procedure Laterality Date  . Cholecystectomy     No family history on file. History  Substance Use Topics  . Smoking status: Former Smoker    Quit date: 12/27/2009  . Smokeless tobacco: Not on file  . Alcohol Use: No   OB History   Grav Para Term Preterm Abortions TAB SAB Ect Mult Living                 Review of Systems  Constitutional: Negative for fever, chills, diaphoresis and fatigue.  HENT:  Negative for sore throat and neck pain.   Eyes: Positive for photophobia and pain. Negative for discharge, redness and visual disturbance.  Respiratory: Negative for cough.   Cardiovascular: Negative for chest pain.  Gastrointestinal: Negative for nausea, vomiting and anorexia.  Musculoskeletal: Negative for myalgias.  Neurological: Positive for headaches. Negative for vertigo, weakness and numbness.  All other systems reviewed and are negative.    Allergies  Sulfa antibiotics  Home Medications   Current Outpatient Rx  Name  Route  Sig  Dispense  Refill  . albuterol (PROVENTIL HFA;VENTOLIN HFA) 108 (90 BASE) MCG/ACT inhaler   Inhalation   Inhale 2 puffs into the lungs every 6 (six) hours as needed. For shortness of breath         . diphenhydramine-acetaminophen (TYLENOL PM EXTRA STRENGTH) 25-500 MG TABS   Oral   Take 2 tablets by mouth at bedtime as needed. For pain         . ibuprofen (ADVIL,MOTRIN) 800 MG tablet   Oral   Take 1 tablet (800 mg total) by mouth 3 (three) times daily.   21 tablet   0   . lisinopril-hydrochlorothiazide (PRINZIDE,ZESTORETIC) 20-12.5 MG per tablet   Oral   Take 1 tablet by mouth daily.         . metFORMIN (GLUCOPHAGE) 1000 MG tablet   Oral   Take 1,000 mg by mouth 2 (two) times daily with a meal.         . metoCLOPramide (REGLAN)  10 MG tablet   Oral   Take 1 tablet (10 mg total) by mouth 4 (four) times daily as needed. For nausea   30 tablet   0   . omeprazole (PRILOSEC) 10 MG capsule   Oral   Take 10 mg by mouth daily.         Marland Kitchen oxyCODONE-acetaminophen (PERCOCET/ROXICET) 5-325 MG per tablet   Oral   Take 2 tablets by mouth every 4 (four) hours as needed for pain.   16 tablet   0    BP 133/84  Pulse 91  Temp(Src) 98.9 F (37.2 C) (Oral)  Resp 18  Ht 5\' 5"  (1.651 m)  Wt 214 lb (97.07 kg)  BMI 35.61 kg/m2  SpO2 99%  LMP 07/27/2013 Physical Exam  Nursing note and vitals reviewed. Constitutional: She is oriented  to person, place, and time. She appears well-developed and well-nourished. No distress.  HENT:  Head: Normocephalic and atraumatic.  Right Ear: External ear normal.  Left Ear: External ear normal.  Nose: Nose normal.  Mouth/Throat: Oropharynx is clear and moist. No oropharyngeal exudate.  Eyes: Conjunctivae and EOM are normal. Pupils are equal, round, and reactive to light. Right eye exhibits no chemosis, no discharge and no exudate. No foreign body present in the right eye. Left eye exhibits no chemosis, no discharge and no exudate. No foreign body present in the left eye.  Fundoscopic exam:      The right eye shows no hemorrhage and no papilledema. The right eye shows red reflex.       The left eye shows no hemorrhage and no papilledema. The left eye shows red reflex.  Slit lamp exam:      The right eye shows no hyphema and no hypopyon.       The left eye shows corneal abrasion and fluorescein uptake. The left eye shows no foreign body, no hyphema, no hypopyon and no anterior chamber bulge.  Tonopen used and noted to be 15 on left eye. - No redness noted on exam.  Neck: Normal range of motion. Neck supple.  Cardiovascular: Normal rate, regular rhythm and normal heart sounds.  Exam reveals no gallop and no friction rub.   No murmur heard. Pulmonary/Chest: Effort normal and breath sounds normal. No respiratory distress. She has no wheezes. She has no rales. She exhibits no tenderness.  Abdominal: Soft. Bowel sounds are normal. She exhibits no distension.  Musculoskeletal: Normal range of motion. She exhibits no edema and no tenderness.  Lymphadenopathy:    She has no cervical adenopathy.  Neurological: She is alert and oriented to person, place, and time. No cranial nerve deficit.  Skin: Skin is warm and dry. No rash noted. No erythema. No pallor.  Psychiatric: She has a normal mood and affect. Her behavior is normal. Judgment and thought content normal.    ED Course  Procedures  (including critical care time) Labs Review Labs Reviewed - No data to display Imaging Review No results found.  MDM  Corneal Abrasion  Patient with history of cataract removal is already on antibiotic eye drops here for left eye pain.  I do not suspect iritis, retrobulbar hematoma, lens slippage  - I have seen this patient with Dr. Hyacinth Meeker.  Will follow up with opthamologist tomorrow.    Izola Price Marisue Humble, PA-C 08/27/13 2134

## 2013-08-27 NOTE — ED Notes (Signed)
Left eye pain. Lens surgery July 31. The eye surgeon told her the pain is normal.

## 2013-08-30 NOTE — ED Provider Notes (Signed)
Medical screening examination/treatment/procedure(s) were conducted as a shared visit with non-physician practitioner(s) and myself.  I personally evaluated the patient during the encounter  Please see my separate respective documentation pertaining to this patient encounter   Gerardine Peltz D Leonid Manus, MD 08/30/13 1405 

## 2013-09-08 ENCOUNTER — Emergency Department (HOSPITAL_BASED_OUTPATIENT_CLINIC_OR_DEPARTMENT_OTHER)
Admission: EM | Admit: 2013-09-08 | Discharge: 2013-09-08 | Disposition: A | Discharge status: Home / Self Care | Payer: Medicaid Other | Attending: Emergency Medicine | Admitting: Emergency Medicine

## 2013-09-08 ENCOUNTER — Encounter (HOSPITAL_BASED_OUTPATIENT_CLINIC_OR_DEPARTMENT_OTHER): Payer: Self-pay | Admitting: Emergency Medicine

## 2013-09-08 ENCOUNTER — Emergency Department (HOSPITAL_BASED_OUTPATIENT_CLINIC_OR_DEPARTMENT_OTHER): Payer: Medicaid Other

## 2013-09-08 DIAGNOSIS — A499 Bacterial infection, unspecified: Secondary | ICD-10-CM | POA: Insufficient documentation

## 2013-09-08 DIAGNOSIS — Z87891 Personal history of nicotine dependence: Secondary | ICD-10-CM | POA: Insufficient documentation

## 2013-09-08 DIAGNOSIS — K219 Gastro-esophageal reflux disease without esophagitis: Secondary | ICD-10-CM | POA: Insufficient documentation

## 2013-09-08 DIAGNOSIS — E119 Type 2 diabetes mellitus without complications: Secondary | ICD-10-CM | POA: Insufficient documentation

## 2013-09-08 DIAGNOSIS — R197 Diarrhea, unspecified: Secondary | ICD-10-CM | POA: Insufficient documentation

## 2013-09-08 DIAGNOSIS — R1032 Left lower quadrant pain: Secondary | ICD-10-CM | POA: Insufficient documentation

## 2013-09-08 DIAGNOSIS — B9689 Other specified bacterial agents as the cause of diseases classified elsewhere: Secondary | ICD-10-CM | POA: Insufficient documentation

## 2013-09-08 DIAGNOSIS — R109 Unspecified abdominal pain: Secondary | ICD-10-CM

## 2013-09-08 DIAGNOSIS — I10 Essential (primary) hypertension: Secondary | ICD-10-CM | POA: Insufficient documentation

## 2013-09-08 DIAGNOSIS — R110 Nausea: Secondary | ICD-10-CM | POA: Insufficient documentation

## 2013-09-08 DIAGNOSIS — R300 Dysuria: Secondary | ICD-10-CM | POA: Insufficient documentation

## 2013-09-08 DIAGNOSIS — E669 Obesity, unspecified: Secondary | ICD-10-CM | POA: Insufficient documentation

## 2013-09-08 DIAGNOSIS — Z79899 Other long term (current) drug therapy: Secondary | ICD-10-CM | POA: Insufficient documentation

## 2013-09-08 DIAGNOSIS — N760 Acute vaginitis: Secondary | ICD-10-CM | POA: Insufficient documentation

## 2013-09-08 LAB — CBC
Platelets: 221 10*3/uL (ref 150–400)
RDW: 14.4 % (ref 11.5–15.5)
WBC: 8.2 10*3/uL (ref 4.0–10.5)

## 2013-09-08 LAB — COMPREHENSIVE METABOLIC PANEL
AST: 26 U/L (ref 0–37)
Albumin: 3.2 g/dL — ABNORMAL LOW (ref 3.5–5.2)
Chloride: 100 mEq/L (ref 96–112)
Creatinine, Ser: 0.7 mg/dL (ref 0.50–1.10)
Potassium: 3.8 mEq/L (ref 3.5–5.1)
Total Bilirubin: 0.2 mg/dL — ABNORMAL LOW (ref 0.3–1.2)

## 2013-09-08 LAB — URINE MICROSCOPIC-ADD ON

## 2013-09-08 LAB — URINALYSIS, ROUTINE W REFLEX MICROSCOPIC
Glucose, UA: >1000 mg/dL — AB
Ketones, ur: 15 mg/dL — AB
Protein, ur: 30 mg/dL — AB

## 2013-09-08 LAB — WET PREP, GENITAL
Trich, Wet Prep: NONE SEEN
Yeast Wet Prep HPF POC: NONE SEEN

## 2013-09-08 MED ORDER — IOHEXOL 300 MG/ML  SOLN
125.0000 mL | Freq: Once | INTRAMUSCULAR | Status: AC | PRN
  Administered 2013-09-08: 125 mL via INTRAVENOUS

## 2013-09-08 MED ORDER — KETOROLAC TROMETHAMINE 30 MG/ML IJ SOLN
30.0000 mg | Freq: Once | INTRAMUSCULAR | Status: AC
  Administered 2013-09-08: 30 mg via INTRAVENOUS
  Filled 2013-09-08: qty 1

## 2013-09-08 MED ORDER — METRONIDAZOLE 500 MG PO TABS: 500.0000 mg | ORAL_TABLET | Freq: Two times a day (BID) | ORAL | Status: DC

## 2013-09-08 MED ORDER — IBUPROFEN 800 MG PO TABS: 800.0000 mg | ORAL_TABLET | Freq: Three times a day (TID) | ORAL | Status: DC

## 2013-09-08 MED ORDER — IOHEXOL 300 MG/ML  SOLN
50.0000 mL | Freq: Once | INTRAMUSCULAR | Status: AC | PRN
  Administered 2013-09-08: 50 mL via ORAL

## 2013-09-08 MED ORDER — ONDANSETRON HCL 4 MG/2ML IJ SOLN
4.0000 mg | Freq: Once | INTRAMUSCULAR | Status: AC
Start: 2013-09-08 — End: 2013-09-08
  Administered 2013-09-08: 4 mg via INTRAVENOUS
  Filled 2013-09-08: qty 2

## 2013-09-08 NOTE — ED Notes (Signed)
"  My ovaries are hurting.'  Also c/o pain when urinating.  Ambulatory without difficulty.  C/o nausea, no vomiting.  Denies fever.

## 2013-09-08 NOTE — ED Provider Notes (Signed)
CSN: [id number]     Arrival date & time 09/08/13  1657 History  This chart was scribed for Dagmar Hait, MD by Ronal Fear, ED Scribe. This patient was seen in room MH07/MH07 and the patient's care was started at 6:05 PM.   Chief Complaint  Patient presents with  . Abdominal Pain    HPI Comments: History of multiple episodes of lower abdominal pain secondary to ovarian cysts  Patient is a 47 y.o. female presenting with abdominal pain. The history is provided by the patient. No language interpreter was used.  Abdominal Pain Pain location:  RLQ and LLQ Pain quality: sharp   Pain radiates to:  Back (infrequently ) Pain severity:  Moderate Onset quality:  Sudden Duration:  1 day Timing:  Intermittent Progression:  Waxing and waning Chronicity:  New Relieved by:  Nothing Worsened by:  Urination Ineffective treatments:  None tried Associated symptoms: diarrhea, dysuria and nausea   Associated symptoms: no chills, no fever, no hematochezia, no hematuria and no vomiting    Pt is having irregular period, she has vaginal discharge due to yeast infection  Past Medical History  Diagnosis Date  . Hypertension   . Diabetes mellitus   . GERD (gastroesophageal reflux disease)   . Obesity    Past Surgical History  Procedure Laterality Date  . Cholecystectomy     History reviewed. No pertinent family history. History  Substance Use Topics  . Smoking status: Former Smoker    Quit date: 12/27/2009  . Smokeless tobacco: Not on file  . Alcohol Use: No   OB History   Grav Para Term Preterm Abortions TAB SAB Ect Mult Living                 Review of Systems  Constitutional: Negative for fever and chills.  Gastrointestinal: Positive for nausea, abdominal pain and diarrhea. Negative for vomiting and hematochezia.  Genitourinary: Positive for dysuria. Negative for hematuria.  All other systems reviewed and are negative.    Allergies  Sulfa antibiotics  Home Medications    Current Outpatient Rx  Name  Route  Sig  Dispense  Refill  . albuterol (PROVENTIL HFA;VENTOLIN HFA) 108 (90 BASE) MCG/ACT inhaler   Inhalation   Inhale 2 puffs into the lungs every 6 (six) hours as needed. For shortness of breath         . diphenhydramine-acetaminophen (TYLENOL PM EXTRA STRENGTH) 25-500 MG TABS   Oral   Take 2 tablets by mouth at bedtime as needed. For pain         . HYDROcodone-acetaminophen (NORCO/VICODIN) 5-325 MG per tablet   Oral   Take 1 tablet by mouth every 4 (four) hours as needed for pain.   10 tablet   0   . ibuprofen (ADVIL,MOTRIN) 800 MG tablet   Oral   Take 1 tablet (800 mg total) by mouth 3 (three) times daily.   21 tablet   0   . lisinopril-hydrochlorothiazide (PRINZIDE,ZESTORETIC) 20-12.5 MG per tablet   Oral   Take 1 tablet by mouth daily.         . metFORMIN (GLUCOPHAGE) 1000 MG tablet   Oral   Take 1,000 mg by mouth 2 (two) times daily with a meal.         . metoCLOPramide (REGLAN) 10 MG tablet   Oral   Take 1 tablet (10 mg total) by mouth 4 (four) times daily as needed. For nausea   30 tablet   0   .  omeprazole (PRILOSEC) 10 MG capsule   Oral   Take 10 mg by mouth daily.         Marland Kitchen oxyCODONE-acetaminophen (PERCOCET/ROXICET) 5-325 MG per tablet   Oral   Take 2 tablets by mouth every 4 (four) hours as needed for pain.   16 tablet   0    BP 124/76  Pulse 92  Temp(Src) 98.7 F (37.1 C) (Oral)  Resp 16  Ht 5\' 5"  (1.651 m)  Wt 220 lb (99.791 kg)  BMI 36.61 kg/m2  SpO2 97%  LMP 07/27/2013 Physical Exam  Nursing note and vitals reviewed. Constitutional: She is oriented to person, place, and time. She appears well-developed and well-nourished. No distress.  HENT:  Head: Normocephalic and atraumatic.  Eyes: EOM are normal.  Neck: Neck supple. No tracheal deviation present.  Cardiovascular: Normal rate and normal heart sounds.   Pulmonary/Chest: Effort normal. No respiratory distress.  Abdominal: She exhibits no  mass. There is tenderness (lower abd). There is guarding (voluntary lower abd). There is no rebound.  Musculoskeletal: Normal range of motion.  Neurological: She is alert and oriented to person, place, and time.  Skin: Skin is warm and dry.  Psychiatric: She has a normal mood and affect. Her behavior is normal.    ED Course  Procedures (including critical care time)  DIAGNOSTIC STUDIES: Oxygen Saturation is 97% on RA, adequate by my interpretation.    COORDINATION OF CARE: 6:31 PM- Pt advised of plan for treatment including urinalysis and pt agrees.       Labs Review Labs Reviewed  URINALYSIS, ROUTINE W REFLEX MICROSCOPIC - Abnormal; Notable for the following:    Color, Urine AMBER (*)    APPearance CLOUDY (*)    Specific Gravity, Urine 1.041 (*)    Glucose, UA >1000 (*)    Hgb urine dipstick MODERATE (*)    Ketones, ur 15 (*)    Protein, ur 30 (*)    All other components within normal limits  URINE MICROSCOPIC-ADD ON - Abnormal; Notable for the following:    Bacteria, UA FEW (*)    All other components within normal limits   Imaging Review Ct Abdomen Pelvis W Contrast  09/08/2013   CLINICAL DATA:  Lower abdominal pain.  EXAM: CT ABDOMEN AND PELVIS WITH CONTRAST  TECHNIQUE: Multidetector CT imaging of the abdomen and pelvis was performed using the standard protocol following bolus administration of intravenous contrast.  CONTRAST:  50mL OMNIPAQUE IOHEXOL 300 MG/ML SOLN, OMNIPAQUE IOHEXOL 300 MG/ML SOLN  COMPARISON:  None.  FINDINGS: BODY WALL: Unremarkable.  LOWER CHEST:  Mediastinum: Unremarkable.  Lungs/pleura: No consolidation.  ABDOMEN/PELVIS:  Liver: No focal abnormality.  Biliary: Cholecystectomy.  Pancreas: Unremarkable.  Spleen: Upper limits of normal size, 12 cm in length.  Adrenals: Unremarkable.  Kidneys and ureters: No hydronephrosis or stone.  Bladder: Unremarkable.  Bowel: No obstruction. Normal appendix.  Retroperitoneum: No mass or adenopathy.  Peritoneum:  No free fluid or gas.  Reproductive: Unremarkable.  Vascular: No acute abnormality.  OSSEOUS: No acute abnormalities. No suspicious lytic or blastic lesions.  IMPRESSION: Negative CT of the abdomen.   Electronically Signed   By: Tiburcio Pea   On: 09/08/2013 22:06    MDM   1. Abdominal pain   2. Bacterial vaginosis    47 year old female presents with lower abdominal pain. She states she's had this pain for her ovarian cyst but this is worse than before. She denies any fever, nausea, vomiting, diarrhea. She denies any vaginal discharge  or vaginal bleeding. She she recently treated herself for yeast infection. Vitals are stable here. On exam, she is diffuse lower quadrant pain. Pelvic exam normal without any adnexal swelling or tenderness or cervical motion tenderness. CT scan she has diffuse lower bowel pain and no adnexal pains suspicious for cysts. Labs show BV, and Flagyl given CT scan normal. Patient stable for discharge. Instructed to followup with her regular doctor.  I personally performed the services described in this documentation, which was scribed in my presence. The recorded information has been reviewed and is accurate.     Dagmar Hait, MD 09/08/13 [phone]

## 2013-09-11 LAB — URINE CULTURE: Colony Count: >=100000

## 2013-09-11 NOTE — ED Notes (Signed)
[  X] Patient discharged originally without antimicrobial agent and treatment is now indicated  47 y.o F who presented with urinary symptoms and was found to have BV and sent home Flagyl. The patient received no antibiotics for a UTI  New antibiotic prescription: Macrobid 100 mg bid x 7 day (continue Flagyl as prescribed)  ED Provider: Junious Silk, PA-C Attempt made to contact patient via phone: Left mail message

## 2013-09-11 NOTE — Progress Notes (Signed)
ED Antimicrobial Stewardship Positive Culture Follow Up   Robin [name] is an 47 y.o. female who presented to [facility] on 09/08/2013 with a chief complaint of abdominal pain  Chief Complaint  Patient presents with  . Abdominal Pain    Recent Results (from the past 720 hour(s))  URINE CULTURE     Status: None   Collection Time    09/08/13  5:19 PM      Result Value Range Status   Specimen Description URINE, CLEAN CATCH   Final   Special Requests NONE   Final   Culture  Setup Time     Final   Value: 09/09/2013 04:40     Performed at [facility] Count     Final   Value: >=100,000 COLONIES/ML     Performed at [facility]   Culture     Final   Value: ESCHERICHIA COLI     Performed at [facility]   Report Status 09/11/2013 FINAL   Final   Organism ID, Bacteria ESCHERICHIA COLI   Final  GC/CHLAMYDIA PROBE AMP     Status: None   Collection Time    09/08/13  7:21 PM      Result Value Range Status   CT Probe RNA NEGATIVE  NEGATIVE Final   GC Probe RNA NEGATIVE  NEGATIVE Final   Comment: (NOTE)                                                                                              **Normal Reference Range: Negative**          Assay performed using the Gen-Probe APTIMA COMBO2 (R) Assay.     Acceptable specimen types for this assay include APTIMA Swabs (Unisex,     endocervical, urethral, or vaginal), first void urine, and ThinPrep     liquid based cytology samples.     Performed at [facility]  WET PREP, GENITAL     Status: Abnormal   Collection Time    09/08/13  7:22 PM      Result Value Range Status   Yeast Wet Prep HPF POC NONE SEEN  NONE SEEN Final   Trich, Wet Prep NONE SEEN  NONE SEEN Final   Clue Cells Wet Prep HPF POC MODERATE (*) NONE SEEN Final   WBC, Wet Prep HPF POC FEW (*) NONE SEEN Final    [x]  Patient discharged originally without antimicrobial agent and treatment is now indicated  47 y.o F who presented  with urinary symptoms and was found to have BV and sent home Flagyl. The patient received no antibiotics for a UTI  New antibiotic prescription: Macrobid 100 mg bid x 7 day (continue Flagyl as prescribed)  ED Provider: Junious Silk, PA-C  Rolley Sims 09/11/2013, 11:56 AM Infectious Diseases Pharmacist Phone# [phone]

## 2013-10-13 ENCOUNTER — Emergency Department (HOSPITAL_BASED_OUTPATIENT_CLINIC_OR_DEPARTMENT_OTHER)
Admission: EM | Admit: 2013-10-13 | Discharge: 2013-10-14 | Disposition: A | Discharge status: Home / Self Care | Payer: Medicaid Other | Attending: Emergency Medicine | Admitting: Emergency Medicine

## 2013-10-13 ENCOUNTER — Encounter (HOSPITAL_BASED_OUTPATIENT_CLINIC_OR_DEPARTMENT_OTHER): Payer: Self-pay | Admitting: Emergency Medicine

## 2013-10-13 DIAGNOSIS — R739 Hyperglycemia, unspecified: Secondary | ICD-10-CM

## 2013-10-13 DIAGNOSIS — E119 Type 2 diabetes mellitus without complications: Secondary | ICD-10-CM | POA: Insufficient documentation

## 2013-10-13 DIAGNOSIS — Z79899 Other long term (current) drug therapy: Secondary | ICD-10-CM | POA: Insufficient documentation

## 2013-10-13 DIAGNOSIS — Z87891 Personal history of nicotine dependence: Secondary | ICD-10-CM | POA: Insufficient documentation

## 2013-10-13 DIAGNOSIS — Z792 Long term (current) use of antibiotics: Secondary | ICD-10-CM | POA: Insufficient documentation

## 2013-10-13 DIAGNOSIS — B3731 Acute candidiasis of vulva and vagina: Secondary | ICD-10-CM | POA: Insufficient documentation

## 2013-10-13 DIAGNOSIS — I10 Essential (primary) hypertension: Secondary | ICD-10-CM | POA: Insufficient documentation

## 2013-10-13 DIAGNOSIS — Z791 Long term (current) use of non-steroidal anti-inflammatories (NSAID): Secondary | ICD-10-CM | POA: Insufficient documentation

## 2013-10-13 DIAGNOSIS — R110 Nausea: Secondary | ICD-10-CM | POA: Insufficient documentation

## 2013-10-13 DIAGNOSIS — Z9889 Other specified postprocedural states: Secondary | ICD-10-CM | POA: Insufficient documentation

## 2013-10-13 DIAGNOSIS — E669 Obesity, unspecified: Secondary | ICD-10-CM | POA: Insufficient documentation

## 2013-10-13 DIAGNOSIS — B373 Candidiasis of vulva and vagina: Secondary | ICD-10-CM | POA: Insufficient documentation

## 2013-10-13 DIAGNOSIS — R109 Unspecified abdominal pain: Secondary | ICD-10-CM | POA: Insufficient documentation

## 2013-10-13 LAB — URINALYSIS, ROUTINE W REFLEX MICROSCOPIC
Bilirubin Urine: NEGATIVE
Ketones, ur: NEGATIVE mg/dL
Nitrite: NEGATIVE
Protein, ur: NEGATIVE mg/dL
Specific Gravity, Urine: 1.034 — ABNORMAL HIGH (ref 1.005–1.030)
Urobilinogen, UA: 1 mg/dL (ref 0.0–1.0)

## 2013-10-13 LAB — WET PREP, GENITAL: Trich, Wet Prep: NONE SEEN

## 2013-10-13 LAB — URINE MICROSCOPIC-ADD ON

## 2013-10-13 MED ORDER — FLUCONAZOLE 50 MG PO TABS
150.0000 mg | ORAL_TABLET | Freq: Once | ORAL | Status: AC
  Administered 2013-10-13: 150 mg via ORAL
  Filled 2013-10-13 (×2): qty 1

## 2013-10-13 MED ORDER — HYDROCODONE-ACETAMINOPHEN 5-325 MG PO TABS
1.0000 | ORAL_TABLET | Freq: Once | ORAL | Status: DC
  Filled 2013-10-13: qty 1

## 2013-10-13 NOTE — ED Provider Notes (Signed)
CSN: [id number]     Arrival date & time 10/13/13  2124 History   First MD Initiated Contact with Patient 10/13/13 2252     Chief Complaint  Patient presents with  . Vaginal Itching   (Consider location/radiation/quality/duration/timing/severity/associated sxs/prior Treatment) Patient is a 47 y.o. female presenting with vaginal itching. The history is provided by the patient. No language interpreter was used.  Vaginal Itching This is a new problem. The current episode started today. Associated symptoms include abdominal pain and nausea. Pertinent negatives include no chills, coughing, fever or vomiting. Associated symptoms comments: Vaginal itching and burning since this morning. She reports white, thick vaginal discharge. No dysuria, fever. She has had nausea. She states she was recently given antibiotics for a vaginal infection and completed them as directed. She has had yeast infections in the past and states that, with exception of the nausea, this is similar..    Past Medical History  Diagnosis Date  . Hypertension   . Diabetes mellitus   . GERD (gastroesophageal reflux disease)   . Obesity    Past Surgical History  Procedure Laterality Date  . Cholecystectomy     No family history on file. History  Substance Use Topics  . Smoking status: Former Smoker    Quit date: 12/27/2009  . Smokeless tobacco: Not on file  . Alcohol Use: No   OB History   Grav Para Term Preterm Abortions TAB SAB Ect Mult Living                 Review of Systems  Constitutional: Negative for fever and chills.  Respiratory: Negative.  Negative for cough.   Cardiovascular: Negative.   Gastrointestinal: Positive for nausea and abdominal pain. Negative for vomiting.       She reports cramping in lower abdomen.  Genitourinary: Positive for vaginal discharge and vaginal pain. Negative for dysuria, hematuria and genital sores.  Musculoskeletal: Negative.   Skin: Negative.   Neurological: Negative.      Allergies  Sulfa antibiotics  Home Medications   Current Outpatient Rx  Name  Route  Sig  Dispense  Refill  . albuterol (PROVENTIL HFA;VENTOLIN HFA) 108 (90 BASE) MCG/ACT inhaler   Inhalation   Inhale 2 puffs into the lungs every 6 (six) hours as needed. For shortness of breath         . lisinopril-hydrochlorothiazide (PRINZIDE,ZESTORETIC) 20-12.5 MG per tablet   Oral   Take 1 tablet by mouth daily.         . metFORMIN (GLUCOPHAGE) 1000 MG tablet   Oral   Take 1,000 mg by mouth 2 (two) times daily with a meal.         . omeprazole (PRILOSEC) 10 MG capsule   Oral   Take 10 mg by mouth daily.         Marland Kitchen oxyCODONE-acetaminophen (PERCOCET/ROXICET) 5-325 MG per tablet   Oral   Take 2 tablets by mouth every 4 (four) hours as needed for pain.   16 tablet   0   . diphenhydramine-acetaminophen (TYLENOL PM EXTRA STRENGTH) 25-500 MG TABS   Oral   Take 2 tablets by mouth at bedtime as needed. For pain         . HYDROcodone-acetaminophen (NORCO/VICODIN) 5-325 MG per tablet   Oral   Take 1 tablet by mouth every 4 (four) hours as needed for pain.   10 tablet   0   . ibuprofen (ADVIL,MOTRIN) 800 MG tablet   Oral   Take 1 tablet (  800 mg total) by mouth 3 (three) times daily.   21 tablet   0   . ibuprofen (ADVIL,MOTRIN) 800 MG tablet   Oral   Take 1 tablet (800 mg total) by mouth 3 (three) times daily.   21 tablet   0   . metoCLOPramide (REGLAN) 10 MG tablet   Oral   Take 1 tablet (10 mg total) by mouth 4 (four) times daily as needed. For nausea   30 tablet   0   . metroNIDAZOLE (FLAGYL) 500 MG tablet   Oral   Take 1 tablet (500 mg total) by mouth 2 (two) times daily.   14 tablet   0    Pulse 91  Temp(Src) 99.3 F (37.4 C) (Oral)  Ht 5\' 5"  (1.651 m)  Wt 220 lb (99.791 kg)  BMI 36.61 kg/m2  SpO2 99%  LMP 09/15/2013 Physical Exam  Constitutional: She is oriented to person, place, and time. She appears well-developed and well-nourished.  HENT:   Head: Normocephalic.  Neck: Normal range of motion. Neck supple.  Cardiovascular: Normal rate and regular rhythm.   Pulmonary/Chest: Effort normal and breath sounds normal.  Abdominal: Soft. Bowel sounds are normal. There is no rebound and no guarding.  Generally tender abdomen.  Genitourinary: Uterus normal. Vaginal discharge found.  Cervix unremarkable in appearance. Vulva red without lesions, ulcerations or blisters. There is a white, thick discharge present in the vaginal vault.   Musculoskeletal: Normal range of motion. She exhibits no edema.  Neurological: She is alert and oriented to person, place, and time.  Skin: Skin is warm and dry. No rash noted.  Psychiatric: She has a normal mood and affect.    ED Course  Procedures (including critical care time) Labs Review Labs Reviewed  WET PREP, GENITAL - Abnormal; Notable for the following:    Yeast Wet Prep HPF POC MANY (*)    WBC, Wet Prep HPF POC FEW (*)    All other components within normal limits  URINALYSIS, ROUTINE W REFLEX MICROSCOPIC - Abnormal; Notable for the following:    Specific Gravity, Urine 1.034 (*)    Glucose, UA >1000 (*)    All other components within normal limits  URINE MICROSCOPIC-ADD ON - Abnormal; Notable for the following:    Squamous Epithelial / LPF FEW (*)    All other components within normal limits  GC/CHLAMYDIA PROBE AMP  URINE CULTURE   Imaging Review No results found.  EKG Interpretation   None       MDM  No diagnosis found. 1. Vaginal candidiasis  Vague systemic symptoms of chills and nausea which are not consistent with present vaginal yeast, however, patient appears stable. Will treat with Diflucan, give pain medication and recommend close follow up in 2 days.     Arnoldo Hooker, PA-C 10/14/13 0021

## 2013-10-13 NOTE — ED Notes (Signed)
I took CBG and got result of 310 mg./dcltr.

## 2013-10-13 NOTE — ED Notes (Signed)
Pt c/o vaginal itching since this morning. White discharge present.

## 2013-10-14 MED ORDER — CLOPIDOGREL BISULFATE 300 MG PO TABS
ORAL_TABLET | ORAL | Status: AC
  Filled 2013-10-14: qty 2

## 2013-10-14 MED ORDER — HYDROCODONE-ACETAMINOPHEN 5-325 MG PO TABS: 1.0000 | ORAL_TABLET | ORAL | Status: DC | PRN

## 2013-10-14 MED ORDER — FLUCONAZOLE 150 MG PO TABS: 150.0000 mg | ORAL_TABLET | Freq: Every day | ORAL | Status: AC

## 2013-10-14 NOTE — ED Provider Notes (Signed)
Medical screening examination/treatment/procedure(s) were performed by non-physician practitioner and as supervising physician I was immediately available for consultation/collaboration.   Momodou Consiglio L Adysen Raphael, MD 10/14/13 0410 

## 2013-10-15 LAB — GC/CHLAMYDIA PROBE AMP
CT Probe RNA: NEGATIVE
GC Probe RNA: NEGATIVE

## 2013-10-15 LAB — URINE CULTURE: Colony Count: NO GROWTH

## 2013-12-01 ENCOUNTER — Emergency Department (HOSPITAL_BASED_OUTPATIENT_CLINIC_OR_DEPARTMENT_OTHER)
Admission: EM | Admit: 2013-12-01 | Discharge: 2013-12-01 | Discharge status: Against Medical Advice | Payer: Medicaid Other

## 2014-01-16 ENCOUNTER — Encounter (HOSPITAL_BASED_OUTPATIENT_CLINIC_OR_DEPARTMENT_OTHER): Payer: Self-pay | Admitting: Emergency Medicine

## 2014-01-16 ENCOUNTER — Emergency Department (HOSPITAL_BASED_OUTPATIENT_CLINIC_OR_DEPARTMENT_OTHER)
Admission: EM | Admit: 2014-01-16 | Discharge: 2014-01-16 | Disposition: A | Discharge status: Home / Self Care | Payer: Medicaid Other | Attending: Emergency Medicine | Admitting: Emergency Medicine

## 2014-01-16 ENCOUNTER — Emergency Department (HOSPITAL_BASED_OUTPATIENT_CLINIC_OR_DEPARTMENT_OTHER): Payer: Medicaid Other

## 2014-01-16 DIAGNOSIS — K219 Gastro-esophageal reflux disease without esophagitis: Secondary | ICD-10-CM | POA: Insufficient documentation

## 2014-01-16 DIAGNOSIS — R519 Headache, unspecified: Secondary | ICD-10-CM

## 2014-01-16 DIAGNOSIS — R51 Headache: Secondary | ICD-10-CM | POA: Insufficient documentation

## 2014-01-16 DIAGNOSIS — E119 Type 2 diabetes mellitus without complications: Secondary | ICD-10-CM | POA: Insufficient documentation

## 2014-01-16 DIAGNOSIS — R5383 Other fatigue: Secondary | ICD-10-CM

## 2014-01-16 DIAGNOSIS — R110 Nausea: Secondary | ICD-10-CM | POA: Insufficient documentation

## 2014-01-16 DIAGNOSIS — Z79899 Other long term (current) drug therapy: Secondary | ICD-10-CM | POA: Insufficient documentation

## 2014-01-16 DIAGNOSIS — Z791 Long term (current) use of non-steroidal anti-inflammatories (NSAID): Secondary | ICD-10-CM | POA: Insufficient documentation

## 2014-01-16 DIAGNOSIS — E669 Obesity, unspecified: Secondary | ICD-10-CM | POA: Insufficient documentation

## 2014-01-16 DIAGNOSIS — Z9089 Acquired absence of other organs: Secondary | ICD-10-CM | POA: Insufficient documentation

## 2014-01-16 DIAGNOSIS — R5381 Other malaise: Secondary | ICD-10-CM | POA: Insufficient documentation

## 2014-01-16 DIAGNOSIS — Z87891 Personal history of nicotine dependence: Secondary | ICD-10-CM | POA: Insufficient documentation

## 2014-01-16 DIAGNOSIS — I10 Essential (primary) hypertension: Secondary | ICD-10-CM | POA: Insufficient documentation

## 2014-01-16 LAB — CBC WITH DIFFERENTIAL/PLATELET
BASOS ABS: 0 10*3/uL (ref 0.0–0.1)
Basophils Relative: 0 % (ref 0–1)
Eosinophils Absolute: 0.1 10*3/uL (ref 0.0–0.7)
Eosinophils Relative: 2 % (ref 0–5)
HCT: 38.2 % (ref 36.0–46.0)
HEMOGLOBIN: 12.3 g/dL (ref 12.0–15.0)
LYMPHS PCT: 30 % (ref 12–46)
Lymphs Abs: 2.9 10*3/uL (ref 0.7–4.0)
MCH: 27 pg (ref 26.0–34.0)
MCHC: 32.2 g/dL (ref 30.0–36.0)
MCV: 84 fL (ref 78.0–100.0)
MONO ABS: 0.7 10*3/uL (ref 0.1–1.0)
MONOS PCT: 7 % (ref 3–12)
Neutro Abs: 5.8 10*3/uL (ref 1.7–7.7)
Neutrophils Relative %: 61 % (ref 43–77)
Platelets: 214 10*3/uL (ref 150–400)
RBC: 4.55 MIL/uL (ref 3.87–5.11)
RDW: 14.6 % (ref 11.5–15.5)
WBC: 9.5 10*3/uL (ref 4.0–10.5)

## 2014-01-16 LAB — COMPREHENSIVE METABOLIC PANEL
ALT: 18 U/L (ref 0–35)
AST: 23 U/L (ref 0–37)
Albumin: 3.2 g/dL — ABNORMAL LOW (ref 3.5–5.2)
Alkaline Phosphatase: 91 U/L (ref 39–117)
BILIRUBIN TOTAL: 0.2 mg/dL — AB (ref 0.3–1.2)
BUN: 8 mg/dL (ref 6–23)
CO2: 27 mEq/L (ref 19–32)
CREATININE: 0.7 mg/dL (ref 0.50–1.10)
Calcium: 9 mg/dL (ref 8.4–10.5)
Chloride: 101 mEq/L (ref 96–112)
GFR calc Af Amer: >90 mL/min (ref >90)
GFR calc non Af Amer: >90 mL/min (ref >90)
Glucose, Bld: 154 mg/dL — ABNORMAL HIGH (ref 70–99)
Potassium: 3.8 mEq/L (ref 3.7–5.3)
Sodium: 141 mEq/L (ref 137–147)
Total Protein: 6.8 g/dL (ref 6.0–8.3)

## 2014-01-16 MED ORDER — KETOROLAC TROMETHAMINE 30 MG/ML IJ SOLN
30.0000 mg | Freq: Once | INTRAMUSCULAR | Status: AC
  Administered 2014-01-16: 30 mg via INTRAVENOUS
  Filled 2014-01-16: qty 1

## 2014-01-16 MED ORDER — HYDROMORPHONE HCL PF 1 MG/ML IJ SOLN
1.0000 mg | Freq: Once | INTRAMUSCULAR | Status: AC
  Administered 2014-01-16: 1 mg via INTRAVENOUS
  Filled 2014-01-16: qty 1

## 2014-01-16 MED ORDER — DIPHENHYDRAMINE HCL 50 MG/ML IJ SOLN
25.0000 mg | Freq: Once | INTRAMUSCULAR | Status: AC
  Administered 2014-01-16: 25 mg via INTRAVENOUS
  Filled 2014-01-16: qty 1

## 2014-01-16 MED ORDER — HYDROCODONE-ACETAMINOPHEN 5-325 MG PO TABS: 2.0000 | ORAL_TABLET | ORAL | Status: DC | PRN

## 2014-01-16 MED ORDER — SODIUM CHLORIDE 0.9 % IV SOLN
Freq: Once | INTRAVENOUS | Status: AC
  Administered 2014-01-16: 1000 mL via INTRAVENOUS

## 2014-01-16 MED ORDER — METOCLOPRAMIDE HCL 5 MG/ML IJ SOLN
10.0000 mg | Freq: Once | INTRAMUSCULAR | Status: AC
  Administered 2014-01-16: 10 mg via INTRAVENOUS
  Filled 2014-01-16: qty 2

## 2014-01-16 MED ORDER — SUMATRIPTAN SUCCINATE 100 MG PO TABS: 100.0000 mg | ORAL_TABLET | ORAL | Status: DC | PRN

## 2014-01-16 NOTE — ED Provider Notes (Signed)
CSN: [id number]     Arrival date & time 01/16/14  1632 History   First MD Initiated Contact with Patient 01/16/14 1655     Chief Complaint  Patient presents with  . Headache   (Consider location/radiation/quality/duration/timing/severity/associated sxs/prior Treatment) Patient is a 48 y.o. female presenting with headaches. The history is provided by the patient. No language interpreter was used.  Headache Pain location:  Frontal Quality:  Unable to specify Radiates to:  Does not radiate Severity currently:  10/10 Onset quality:  Gradual Duration:  3 days Timing:  Constant Progression:  Worsening Chronicity:  New Relieved by:  Nothing Worsened by:  Nothing tried Ineffective treatments:  None tried Associated symptoms: focal weakness and nausea   Associated symptoms: no loss of balance     Past Medical History  Diagnosis Date  . Hypertension   . Diabetes mellitus   . GERD (gastroesophageal reflux disease)   . Obesity    Past Surgical History  Procedure Laterality Date  . Cholecystectomy     History reviewed. No pertinent family history. History  Substance Use Topics  . Smoking status: Former Smoker    Quit date: 12/27/2009  . Smokeless tobacco: Not on file  . Alcohol Use: No   OB History   Grav Para Term Preterm Abortions TAB SAB Ect Mult Living                 Review of Systems  Gastrointestinal: Positive for nausea.  Neurological: Positive for focal weakness and headaches. Negative for loss of balance.  All other systems reviewed and are negative.    Allergies  Sulfa antibiotics  Home Medications   Current Outpatient Rx  Name  Route  Sig  Dispense  Refill  . albuterol (PROVENTIL HFA;VENTOLIN HFA) 108 (90 BASE) MCG/ACT inhaler   Inhalation   Inhale 2 puffs into the lungs every 6 (six) hours as needed. For shortness of breath         . diphenhydramine-acetaminophen (TYLENOL PM EXTRA STRENGTH) 25-500 MG TABS   Oral   Take 2 tablets by mouth at  bedtime as needed. For pain         . HYDROcodone-acetaminophen (NORCO/VICODIN) 5-325 MG per tablet   Oral   Take 1 tablet by mouth every 4 (four) hours as needed for pain.   10 tablet   0   . HYDROcodone-acetaminophen (NORCO/VICODIN) 5-325 MG per tablet   Oral   Take 1 tablet by mouth every 4 (four) hours as needed for pain.   8 tablet   0   . ibuprofen (ADVIL,MOTRIN) 800 MG tablet   Oral   Take 1 tablet (800 mg total) by mouth 3 (three) times daily.   21 tablet   0   . ibuprofen (ADVIL,MOTRIN) 800 MG tablet   Oral   Take 1 tablet (800 mg total) by mouth 3 (three) times daily.   21 tablet   0   . lisinopril-hydrochlorothiazide (PRINZIDE,ZESTORETIC) 20-12.5 MG per tablet   Oral   Take 1 tablet by mouth daily.         . metFORMIN (GLUCOPHAGE) 1000 MG tablet   Oral   Take 1,000 mg by mouth 2 (two) times daily with a meal.         . metoCLOPramide (REGLAN) 10 MG tablet   Oral   Take 1 tablet (10 mg total) by mouth 4 (four) times daily as needed. For nausea   30 tablet   0   . metroNIDAZOLE (FLAGYL) 500  MG tablet   Oral   Take 1 tablet (500 mg total) by mouth 2 (two) times daily.   14 tablet   0   . omeprazole (PRILOSEC) 10 MG capsule   Oral   Take 10 mg by mouth daily.         Marland Kitchen oxyCODONE-acetaminophen (PERCOCET/ROXICET) 5-325 MG per tablet   Oral   Take 2 tablets by mouth every 4 (four) hours as needed for pain.   16 tablet   0    BP 157/104  Pulse 87  Temp(Src) 98.2 F (36.8 C) (Oral)  Resp 18  Ht 5\' 5"  (1.651 m)  Wt 220 lb (99.791 kg)  BMI 36.61 kg/m2  SpO2 100%  LMP 01/14/2014 Physical Exam  Nursing note and vitals reviewed. Constitutional: She is oriented to person, place, and time. She appears well-developed and well-nourished.  HENT:  Head: Normocephalic and atraumatic.  Right Ear: External ear normal.  Left Ear: External ear normal.  Nose: Nose normal.  Mouth/Throat: Oropharynx is clear and moist.  Eyes: Conjunctivae and EOM are  normal. Pupils are equal, round, and reactive to light.  Neck: Normal range of motion.  Cardiovascular: Normal rate and normal heart sounds.   Pulmonary/Chest: Effort normal.  Abdominal: Soft. She exhibits no distension.  Musculoskeletal: Normal range of motion.  Neurological: She is alert and oriented to person, place, and time.  Skin: Skin is warm.  Psychiatric: She has a normal mood and affect.    ED Course  Procedures (including critical care time) Labs Review Labs Reviewed  COMPREHENSIVE METABOLIC PANEL - Abnormal; Notable for the following:    Glucose, Bld 154 (*)    Albumin 3.2 (*)    Total Bilirubin 0.2 (*)    All other components within normal limits  CBC WITH DIFFERENTIAL   Imaging Review Ct Head Wo Contrast  01/16/2014   CLINICAL DATA:  Headache for 3 days.  EXAM: CT HEAD WITHOUT CONTRAST  TECHNIQUE: Contiguous axial images were obtained from the base of the skull through the vertex without intravenous contrast.  COMPARISON:  None.  FINDINGS: Ventricles are normal in size and configuration  No parenchymal masses or mass effect. No areas of abnormal parenchymal attenuation. No evidence of a cortical infarct.  No extra-axial masses or abnormal fluid collections.  There is no intracranial hemorrhage.  No skull lesion.  Clear sinuses and mastoid air cells.  IMPRESSION: Normal unenhanced CT scan of the brain.   Electronically Signed   By: Amie Portland M.D.   On: 01/16/2014 19:20    EKG Interpretation   None       MDM   1. Headache    Pt reports some relief with migraine cocktail.   Pt given dilaudid and pt reports headache 2-10   Labs normal,   Ct head normal.  Imitrex and hydrocodone.   See your Physicain for recheck   Elson Areas, Cordelia Poche 01/16/14 2035

## 2014-01-16 NOTE — ED Notes (Signed)
Patient transported to CT via stretcher per tech. 

## 2014-01-16 NOTE — ED Provider Notes (Signed)
Medical screening examination/treatment/procedure(s) were performed by non-physician practitioner and as supervising physician I was immediately available for consultation/collaboration.  EKG Interpretation   None         Dagmar HaitWilliam Vera Wishart, MD 01/16/14 [phone]

## 2014-01-16 NOTE — ED Notes (Signed)
Pt ambulatory to restroom no difficulty.  

## 2014-01-16 NOTE — ED Notes (Signed)
Pt reports h/a x 3 days with nausea

## 2014-01-16 NOTE — Discharge Instructions (Signed)

## 2014-01-16 NOTE — ED Notes (Signed)
PA at bedside.

## 2014-02-25 ENCOUNTER — Emergency Department (HOSPITAL_BASED_OUTPATIENT_CLINIC_OR_DEPARTMENT_OTHER)
Admission: EM | Admit: 2014-02-25 | Discharge: 2014-02-25 | Disposition: A | Discharge status: Home / Self Care | Payer: Medicaid Other | Attending: Emergency Medicine | Admitting: Emergency Medicine

## 2014-02-25 ENCOUNTER — Emergency Department (HOSPITAL_BASED_OUTPATIENT_CLINIC_OR_DEPARTMENT_OTHER): Payer: Medicaid Other

## 2014-02-25 ENCOUNTER — Encounter (HOSPITAL_BASED_OUTPATIENT_CLINIC_OR_DEPARTMENT_OTHER): Payer: Self-pay | Admitting: Emergency Medicine

## 2014-02-25 DIAGNOSIS — M549 Dorsalgia, unspecified: Secondary | ICD-10-CM | POA: Insufficient documentation

## 2014-02-25 DIAGNOSIS — K219 Gastro-esophageal reflux disease without esophagitis: Secondary | ICD-10-CM | POA: Insufficient documentation

## 2014-02-25 DIAGNOSIS — E669 Obesity, unspecified: Secondary | ICD-10-CM | POA: Insufficient documentation

## 2014-02-25 DIAGNOSIS — M25562 Pain in left knee: Secondary | ICD-10-CM

## 2014-02-25 DIAGNOSIS — Z791 Long term (current) use of non-steroidal anti-inflammatories (NSAID): Secondary | ICD-10-CM | POA: Insufficient documentation

## 2014-02-25 DIAGNOSIS — R911 Solitary pulmonary nodule: Secondary | ICD-10-CM | POA: Insufficient documentation

## 2014-02-25 DIAGNOSIS — I10 Essential (primary) hypertension: Secondary | ICD-10-CM | POA: Insufficient documentation

## 2014-02-25 DIAGNOSIS — E119 Type 2 diabetes mellitus without complications: Secondary | ICD-10-CM | POA: Insufficient documentation

## 2014-02-25 DIAGNOSIS — Z87891 Personal history of nicotine dependence: Secondary | ICD-10-CM | POA: Insufficient documentation

## 2014-02-25 DIAGNOSIS — M25569 Pain in unspecified knee: Secondary | ICD-10-CM | POA: Insufficient documentation

## 2014-02-25 DIAGNOSIS — M25561 Pain in right knee: Secondary | ICD-10-CM

## 2014-02-25 DIAGNOSIS — R109 Unspecified abdominal pain: Secondary | ICD-10-CM | POA: Insufficient documentation

## 2014-02-25 DIAGNOSIS — Z3202 Encounter for pregnancy test, result negative: Secondary | ICD-10-CM | POA: Insufficient documentation

## 2014-02-25 DIAGNOSIS — Z79899 Other long term (current) drug therapy: Secondary | ICD-10-CM | POA: Insufficient documentation

## 2014-02-25 DIAGNOSIS — R110 Nausea: Secondary | ICD-10-CM | POA: Insufficient documentation

## 2014-02-25 LAB — URINALYSIS, ROUTINE W REFLEX MICROSCOPIC
BILIRUBIN URINE: NEGATIVE
Glucose, UA: NEGATIVE mg/dL
Hgb urine dipstick: NEGATIVE
KETONES UR: NEGATIVE mg/dL
Leukocytes, UA: NEGATIVE
Nitrite: NEGATIVE
Protein, ur: 30 mg/dL — AB
Specific Gravity, Urine: 1.014 (ref 1.005–1.030)
UROBILINOGEN UA: 1 mg/dL (ref 0.0–1.0)
pH: 6 (ref 5.0–8.0)

## 2014-02-25 LAB — BASIC METABOLIC PANEL
BUN: 5 mg/dL — ABNORMAL LOW (ref 6–23)
CHLORIDE: 106 meq/L (ref 96–112)
CO2: 27 mEq/L (ref 19–32)
Calcium: 9.4 mg/dL (ref 8.4–10.5)
Creatinine, Ser: 0.6 mg/dL (ref 0.50–1.10)
GFR calc non Af Amer: >90 mL/min (ref >90)
Glucose, Bld: 135 mg/dL — ABNORMAL HIGH (ref 70–99)
POTASSIUM: 4.4 meq/L (ref 3.7–5.3)
SODIUM: 143 meq/L (ref 137–147)

## 2014-02-25 LAB — URINE MICROSCOPIC-ADD ON

## 2014-02-25 LAB — PREGNANCY, URINE: Preg Test, Ur: NEGATIVE

## 2014-02-25 MED ORDER — ONDANSETRON HCL 4 MG/2ML IJ SOLN
4.0000 mg | Freq: Once | INTRAMUSCULAR | Status: AC
  Administered 2014-02-25: 4 mg via INTRAVENOUS
  Filled 2014-02-25: qty 2

## 2014-02-25 MED ORDER — TRAMADOL HCL 50 MG PO TABS: 50.0000 mg | ORAL_TABLET | Freq: Four times a day (QID) | ORAL | Status: DC | PRN

## 2014-02-25 MED ORDER — MORPHINE SULFATE 4 MG/ML IJ SOLN
4.0000 mg | Freq: Once | INTRAMUSCULAR | Status: AC
  Administered 2014-02-25: 4 mg via INTRAVENOUS
  Filled 2014-02-25: qty 1

## 2014-02-25 NOTE — Discharge Instructions (Signed)

## 2014-02-25 NOTE — ED Provider Notes (Signed)
CSN: [id number]     Arrival date & time 02/25/14  1132 History   First MD Initiated Contact with Patient 02/25/14 1216     No chief complaint on file.    (Consider location/radiation/quality/duration/timing/severity/associated sxs/prior Treatment) HPI Comments: Pt states that she started having left flank pain that started 3 days ago intermittently. Denies fever. Pt state that she has had urinary frequency. Pt has nausea, without vomiting or diarrhea. Denies abdominal pain. Pt states that she is also have bilateral knee pain. Pt states that she has not had abnormal swelling or redness but they are hurting more and she thinks it may be related to the way she is walking form her back hurting. No recent falls or injury  The history is provided by the patient. No language interpreter was used.    Past Medical History  Diagnosis Date  . Hypertension   . Diabetes mellitus   . GERD (gastroesophageal reflux disease)   . Obesity    Past Surgical History  Procedure Laterality Date  . Cholecystectomy     No family history on file. History  Substance Use Topics  . Smoking status: Former Smoker    Quit date: 12/27/2009  . Smokeless tobacco: Not on file  . Alcohol Use: No   OB History   Grav Para Term Preterm Abortions TAB SAB Ect Mult Living                 Review of Systems  Constitutional: Negative.   Respiratory: Negative.   Cardiovascular: Negative.       Allergies  Sulfa antibiotics  Home Medications   Current Outpatient Rx  Name  Route  Sig  Dispense  Refill  . albuterol (PROVENTIL HFA;VENTOLIN HFA) 108 (90 BASE) MCG/ACT inhaler   Inhalation   Inhale 2 puffs into the lungs every 6 (six) hours as needed. For shortness of breath         . diphenhydramine-acetaminophen (TYLENOL PM EXTRA STRENGTH) 25-500 MG TABS   Oral   Take 2 tablets by mouth at bedtime as needed. For pain         . HYDROcodone-acetaminophen (NORCO/VICODIN) 5-325 MG per tablet   Oral   Take 1  tablet by mouth every 4 (four) hours as needed for pain.   10 tablet   0   . HYDROcodone-acetaminophen (NORCO/VICODIN) 5-325 MG per tablet   Oral   Take 1 tablet by mouth every 4 (four) hours as needed for pain.   8 tablet   0   . HYDROcodone-acetaminophen (NORCO/VICODIN) 5-325 MG per tablet   Oral   Take 2 tablets by mouth every 4 (four) hours as needed.   10 tablet   0   . ibuprofen (ADVIL,MOTRIN) 800 MG tablet   Oral   Take 1 tablet (800 mg total) by mouth 3 (three) times daily.   21 tablet   0   . ibuprofen (ADVIL,MOTRIN) 800 MG tablet   Oral   Take 1 tablet (800 mg total) by mouth 3 (three) times daily.   21 tablet   0   . lisinopril-hydrochlorothiazide (PRINZIDE,ZESTORETIC) 20-12.5 MG per tablet   Oral   Take 1 tablet by mouth daily.         . metFORMIN (GLUCOPHAGE) 1000 MG tablet   Oral   Take 1,000 mg by mouth 2 (two) times daily with a meal.         . metoCLOPramide (REGLAN) 10 MG tablet   Oral   Take 1 tablet (  10 mg total) by mouth 4 (four) times daily as needed. For nausea   30 tablet   0   . metroNIDAZOLE (FLAGYL) 500 MG tablet   Oral   Take 1 tablet (500 mg total) by mouth 2 (two) times daily.   14 tablet   0   . omeprazole (PRILOSEC) 10 MG capsule   Oral   Take 10 mg by mouth daily.         Marland Kitchen oxyCODONE-acetaminophen (PERCOCET/ROXICET) 5-325 MG per tablet   Oral   Take 2 tablets by mouth every 4 (four) hours as needed for pain.   16 tablet   0   . SUMAtriptan (IMITREX) 100 MG tablet   Oral   Take 1 tablet (100 mg total) by mouth every 2 (two) hours as needed for migraine or headache. May repeat in 2 hours if headache persists or recurs.   10 tablet   0    BP 162/95  Pulse 76  Temp(Src) 98.2 F (36.8 C) (Oral)  Resp 20  Ht 5\' 5"  (1.651 m)  Wt 220 lb (99.791 kg)  BMI 36.61 kg/m2  SpO2 95% Physical Exam  Nursing note and vitals reviewed. Constitutional: She is oriented to person, place, and time. She appears well-developed  and well-nourished.  HENT:  Head: Normocephalic and atraumatic.  Cardiovascular: Normal rate and regular rhythm.   Pulmonary/Chest: Effort normal and breath sounds normal.  Abdominal: Soft. Bowel sounds are normal.  Musculoskeletal: Normal range of motion.  No swelling or deformity noted to knee bilaterally. Mild tenderness to left lumbar spine  Neurological: She is alert and oriented to person, place, and time. She exhibits abnormal muscle tone. Coordination normal.  Skin: Skin is warm and dry.  Psychiatric: She has a normal mood and affect.    ED Course  Procedures (including critical care time) Labs Review Labs Reviewed  URINALYSIS, ROUTINE W REFLEX MICROSCOPIC - Abnormal; Notable for the following:    APPearance CLOUDY (*)    Protein, ur 30 (*)    All other components within normal limits  URINE MICROSCOPIC-ADD ON - Abnormal; Notable for the following:    Squamous Epithelial / LPF FEW (*)    Bacteria, UA FEW (*)    All other components within normal limits  PREGNANCY, URINE  BASIC METABOLIC PANEL   Imaging Review Ct Abdomen Pelvis Wo Contrast  02/25/2014   CLINICAL DATA:  Left flank pain.  EXAM: CT ABDOMEN AND PELVIS WITHOUT CONTRAST  TECHNIQUE: Multidetector CT imaging of the abdomen and pelvis was performed following the standard protocol without intravenous contrast.  COMPARISON:  09/08/2013  FINDINGS: Subtle 2 mm nodule at the right lung base is nonspecific and could be incidental. This subtle nodule is seen on sequence 4, image 10 and stable since 09/08/2013. No evidence for free air.  Negative for kidney stones or hydronephrosis. Again noted is a right extrarenal pelvis. Normal appearance of the adrenal glands and the gallbladder has been removed. No gross abnormality to the liver, spleen or pancreas. Normal appearance of the stomach and bowel structures. No gross abnormality to the uterus or adnexal structures on this non-contrast examination. No significant free fluid or  lymphadenopathy.  Diverticulosis in the sigmoid colon without acute colonic inflammation.  No acute bone abnormality.  IMPRESSION: No acute abnormalities within the abdomen or pelvis. No evidence for kidney stones or hydronephrosis.  Colonic diverticulosis without acute colonic inflammation.  2 mm nodule at the right lung base. If the patient is at high risk  for bronchogenic carcinoma, follow-up chest CT at 1 year is recommended. If the patient is at low risk, no follow-up is needed. This recommendation follows the consensus statement: Guidelines for Management of Small Pulmonary Nodules Detected on CT Scans: A Statement from the Fleischner Society as published in Radiology 2005; 237:395-400.   Electronically Signed   By: Richarda OverlieAdam  Henn M.D.   On: 02/25/2014 13:42     EKG Interpretation None      MDM   Final diagnoses:  Lung nodule  Flank pain  Back pain  Knee pain, bilateral    Discussed long nodule with pt has pcp to follow up with . Pain likely musculoskeletal in nature. Will treat with Leim Fabryultram    Arine Foley, NP 02/25/14 [phone]

## 2014-02-25 NOTE — ED Notes (Signed)
Pain in her knees for a few days. Pain in her back worse on the left side. Her daughter states she has been walking differently due to pain her knees and she feels back pain is due to that.

## 2014-02-25 NOTE — ED Provider Notes (Signed)
Medical screening examination/treatment/procedure(s) were performed by non-physician practitioner and as supervising physician I was immediately available for consultation/collaboration.   EKG Interpretation None        Glynn OctaveStephen Cyree Chuong, MD 02/25/14 1513

## 2014-06-04 ENCOUNTER — Emergency Department (HOSPITAL_BASED_OUTPATIENT_CLINIC_OR_DEPARTMENT_OTHER): Payer: Medicaid Other

## 2014-06-04 ENCOUNTER — Encounter (HOSPITAL_BASED_OUTPATIENT_CLINIC_OR_DEPARTMENT_OTHER): Payer: Self-pay | Admitting: Emergency Medicine

## 2014-06-04 ENCOUNTER — Emergency Department (HOSPITAL_BASED_OUTPATIENT_CLINIC_OR_DEPARTMENT_OTHER)
Admission: EM | Admit: 2014-06-04 | Discharge: 2014-06-04 | Disposition: A | Discharge status: Home / Self Care | Payer: Medicaid Other | Attending: Emergency Medicine | Admitting: Emergency Medicine

## 2014-06-04 DIAGNOSIS — M79605 Pain in left leg: Secondary | ICD-10-CM

## 2014-06-04 DIAGNOSIS — Z792 Long term (current) use of antibiotics: Secondary | ICD-10-CM | POA: Insufficient documentation

## 2014-06-04 DIAGNOSIS — E669 Obesity, unspecified: Secondary | ICD-10-CM | POA: Insufficient documentation

## 2014-06-04 DIAGNOSIS — E119 Type 2 diabetes mellitus without complications: Secondary | ICD-10-CM | POA: Insufficient documentation

## 2014-06-04 DIAGNOSIS — Z79899 Other long term (current) drug therapy: Secondary | ICD-10-CM | POA: Insufficient documentation

## 2014-06-04 DIAGNOSIS — K219 Gastro-esophageal reflux disease without esophagitis: Secondary | ICD-10-CM | POA: Insufficient documentation

## 2014-06-04 DIAGNOSIS — Z87891 Personal history of nicotine dependence: Secondary | ICD-10-CM | POA: Insufficient documentation

## 2014-06-04 DIAGNOSIS — M25569 Pain in unspecified knee: Secondary | ICD-10-CM | POA: Insufficient documentation

## 2014-06-04 DIAGNOSIS — M79604 Pain in right leg: Secondary | ICD-10-CM

## 2014-06-04 DIAGNOSIS — I10 Essential (primary) hypertension: Secondary | ICD-10-CM | POA: Insufficient documentation

## 2014-06-04 DIAGNOSIS — Z791 Long term (current) use of non-steroidal anti-inflammatories (NSAID): Secondary | ICD-10-CM | POA: Insufficient documentation

## 2014-06-04 MED ORDER — OXYCODONE-ACETAMINOPHEN 5-325 MG PO TABS: 1.0000 | ORAL_TABLET | ORAL | Status: DC | PRN

## 2014-06-04 MED ORDER — OXYCODONE-ACETAMINOPHEN 5-325 MG PO TABS
2.0000 | ORAL_TABLET | Freq: Once | ORAL | Status: AC
Start: 2014-06-04 — End: 2014-06-04
  Administered 2014-06-04: 2 via ORAL
  Filled 2014-06-04: qty 2

## 2014-06-04 NOTE — ED Notes (Signed)
Patient transported to X-ray 

## 2014-06-04 NOTE — Discharge Instructions (Signed)
Return tomorrow to given time for ultrasound of your lower extremities.  Percocet as needed for pain.  Your primary Dr. if not improving in the next week.   Musculoskeletal Pain Musculoskeletal pain is muscle and boney aches and pains. These pains can occur in any part of the body. Your caregiver may treat you without knowing the cause of the pain. They may treat you if blood or urine tests, X-rays, and other tests were normal.  CAUSES There is often not a definite cause or reason for these pains. These pains may be caused by a type of germ (virus). The discomfort may also come from overuse. Overuse includes working out too hard when your body is not fit. Boney aches also come from weather changes. Bone is sensitive to atmospheric pressure changes. HOME CARE INSTRUCTIONS   Ask when your test results will be ready. Make sure you get your test results.  Only take over-the-counter or prescription medicines for pain, discomfort, or fever as directed by your caregiver. If you were given medications for your condition, do not drive, operate machinery or power tools, or sign legal documents for 24 hours. Do not drink alcohol. Do not take sleeping pills or other medications that may interfere with treatment.  Continue all activities unless the activities cause more pain. When the pain lessens, slowly resume normal activities. Gradually increase the intensity and duration of the activities or exercise.  During periods of severe pain, bed rest may be helpful. Lay or sit in any position that is comfortable.  Putting ice on the injured area.  Put ice in a bag.  Place a towel between your skin and the bag.  Leave the ice on for 15 to 20 minutes, 3 to 4 times a day.  Follow up with your caregiver for continued problems and no reason can be found for the pain. If the pain becomes worse or does not go away, it may be necessary to repeat tests or do additional testing. Your caregiver may need to look  further for a possible cause. SEEK IMMEDIATE MEDICAL CARE IF:  You have pain that is getting worse and is not relieved by medications.  You develop chest pain that is associated with shortness or breath, sweating, feeling sick to your stomach (nauseous), or throw up (vomit).  Your pain becomes localized to the abdomen.  You develop any new symptoms that seem different or that concern you. MAKE SURE YOU:   Understand these instructions.  Will watch your condition.  Will get help right away if you are not doing well or get worse. Document Released: 12/13/2005 Document Revised: 03/06/2012 Document Reviewed: 08/17/2013 [facility] Patient Information 2014 [place], [place].

## 2014-06-04 NOTE — ED Notes (Signed)
Pain behind both her knees for a month.

## 2014-06-04 NOTE — ED Provider Notes (Signed)
CSN: [id number]     Arrival date & time 06/04/14  1845 History  This chart was scribed for Geoffery Lyonsouglas Prescilla Monger, MD by Bronson CurbJacqueline Melvin, ED Scribe. This patient was seen in room MH05/MH05 and the patient's care was started at 7:01 PM.    Chief Complaint  Patient presents with  . Knee Pain     The history is provided by the patient. No language interpreter was used.    HPI Comments: Robin [name] is a 48 y.o. female who presents to the Emergency Department complaining of bilateral knee pain that began 1 month ago. Patient states she is experiencing pain in the back of her knees. She reports the pain in the right knee radiates up her leg to her lower back, and the pain in the left knee radiates down her left leg. She has taken Advil and Tylenol with no improvement. Patient states she has history of arthritis, but states this does not feel similar. She denies any falls, injuries, or trauma. Patient is in the process of moving (to [place]) and is not established with a PCP.  Past Medical History  Diagnosis Date  . Hypertension   . Diabetes mellitus   . GERD (gastroesophageal reflux disease)   . Obesity    Past Surgical History  Procedure Laterality Date  . Cholecystectomy     No family history on file. History  Substance Use Topics  . Smoking status: Former Smoker    Quit date: 12/27/2009  . Smokeless tobacco: Not on file  . Alcohol Use: No   OB History   Grav Para Term Preterm Abortions TAB SAB Ect Mult Living                 Review of Systems  A complete 10 system review of systems was obtained and all systems are negative except as noted in the HPI and PMH.    Allergies  Sulfa antibiotics  Home Medications   Prior to Admission medications   Medication Sig Start Date End Date Taking? Authorizing Provider  albuterol (PROVENTIL HFA;VENTOLIN HFA) 108 (90 BASE) MCG/ACT inhaler Inhale 2 puffs into the lungs every 6 (six) hours as needed. For shortness of breath    Historical  Provider, MD  diphenhydramine-acetaminophen (TYLENOL PM EXTRA STRENGTH) 25-500 MG TABS Take 2 tablets by mouth at bedtime as needed. For pain    Historical Provider, MD  HYDROcodone-acetaminophen (NORCO/VICODIN) 5-325 MG per tablet Take 1 tablet by mouth every 4 (four) hours as needed for pain. 08/27/13   Izola PriceFrances C. Sanford, PA-C  HYDROcodone-acetaminophen (NORCO/VICODIN) 5-325 MG per tablet Take 1 tablet by mouth every 4 (four) hours as needed for pain. 10/14/13   Shari A Upstill, PA-C  HYDROcodone-acetaminophen (NORCO/VICODIN) 5-325 MG per tablet Take 2 tablets by mouth every 4 (four) hours as needed. 01/16/14   Elson AreasLeslie K Sofia, PA-C  ibuprofen (ADVIL,MOTRIN) 800 MG tablet Take 1 tablet (800 mg total) by mouth 3 (three) times daily. 07/02/13   Fayrene HelperBowie Tran, PA-C  ibuprofen (ADVIL,MOTRIN) 800 MG tablet Take 1 tablet (800 mg total) by mouth 3 (three) times daily. 09/08/13   Dagmar HaitWilliam Blair Walden, MD  lisinopril-hydrochlorothiazide (PRINZIDE,ZESTORETIC) 20-12.5 MG per tablet Take 1 tablet by mouth daily.    Historical Provider, MD  metFORMIN (GLUCOPHAGE) 1000 MG tablet Take 1,000 mg by mouth 2 (two) times daily with a meal.    Historical Provider, MD  metoCLOPramide (REGLAN) 10 MG tablet Take 1 tablet (10 mg total) by mouth 4 (four) times daily as needed.  For nausea 11/04/12   Charles B. Bernette Mayers, MD  metroNIDAZOLE (FLAGYL) 500 MG tablet Take 1 tablet (500 mg total) by mouth 2 (two) times daily. 09/08/13   Dagmar Hait, MD  omeprazole (PRILOSEC) 10 MG capsule Take 10 mg by mouth daily.    Historical Provider, MD  oxyCODONE-acetaminophen (PERCOCET/ROXICET) 5-325 MG per tablet Take 2 tablets by mouth every 4 (four) hours as needed for pain. 12/18/12   Elson Areas, PA-C  SUMAtriptan (IMITREX) 100 MG tablet Take 1 tablet (100 mg total) by mouth every 2 (two) hours as needed for migraine or headache. May repeat in 2 hours if headache persists or recurs. 01/16/14   Elson Areas, PA-C  traMADol (ULTRAM) 50 MG  tablet Take 1 tablet (50 mg total) by mouth every 6 (six) hours as needed. 02/25/14   Teressa Lower, NP   Triage Vitals: BP 140/91  Pulse 85  Temp(Src) 98 F (36.7 C) (Oral)  Resp 20  Ht 5\' 5"  (1.651 m)  Wt 220 lb (99.791 kg)  BMI 36.61 kg/m2  SpO2 98%  Physical Exam  Nursing note and vitals reviewed. Constitutional: She is oriented to person, place, and time. She appears well-developed and well-nourished.  HENT:  Head: Normocephalic and atraumatic.  Musculoskeletal: Normal range of motion.  Bilateral knees appear grossly normal. There is no effusion and no crepitus with ROM. There is tenderness in the soft tissues behind the knees, however, no obvious abnormality. Dorsalis pedis pulses are easily palpable bilaterally.  Neurological: She is alert and oriented to person, place, and time.  Skin: Skin is warm and dry.    ED Course  Procedures (including critical care time)  DIAGNOSTIC STUDIES: Oxygen Saturation is 98% on room air, normal by my interpretation.    COORDINATION OF CARE: At 1910 Discussed treatment plan with patient which includes imaging. Patient agrees.   Labs Review Labs Reviewed - No data to display  Imaging Review No results found.   EKG Interpretation None      MDM   Final diagnoses:  None    Patient presents with complaints of pain in the backs of both knees. Her physical exam is unremarkable and x-rays reveal only degenerative changes. I doubt DVT, however I will bring her back tomorrow when ultrasound is present to rule out this possibility. If she is not improving in the next week she is to followup with her primary Dr.  I personally performed the services described in this documentation, which was scribed in my presence. The recorded information has been reviewed and is accurate.      Geoffery Lyons, MD 06/04/14 2009

## 2014-06-05 ENCOUNTER — Other Ambulatory Visit (HOSPITAL_BASED_OUTPATIENT_CLINIC_OR_DEPARTMENT_OTHER): Payer: Medicaid Other

## 2014-06-18 ENCOUNTER — Other Ambulatory Visit: Payer: Self-pay | Admitting: Physician Assistant

## 2014-06-18 NOTE — H&P (Signed)
TOTAL KNEE ADMISSION H&P  Patient is being admitted for right total knee arthroplasty.  Subjective:  Chief Complaint:right knee pain.  HPI: Robin [name], 48 y.o. female, has a history of pain and functional disability in the right knee due to arthritis and has failed non-surgical conservative treatments for greater than 12 weeks to includeNSAID's and/or analgesics, corticosteriod injections and activity modification.  Onset of symptoms was gradual, starting 3 years ago with gradually worsening course since that time. The patient noted no past surgery on the right knee(s).  Patient currently rates pain in the right knee(s) at 10 out of 10 with activity. Patient has night pain, worsening of pain with activity and weight bearing, pain that interferes with activities of daily living, pain with passive range of motion, crepitus and joint swelling.  Patient has evidence of subchondral cysts, subchondral sclerosis and joint space narrowing by imaging studies. There is no active infection.  There are no active problems to display for this patient.  Past Medical History  Diagnosis Date  . Hypertension   . Diabetes mellitus   . GERD (gastroesophageal reflux disease)   . Obesity     Past Surgical History  Procedure Laterality Date  . Cholecystectomy       (Not in a hospital admission) Allergies  Allergen Reactions  . Sulfa Antibiotics Hives    History  Substance Use Topics  . Smoking status: Former Smoker    Quit date: 12/27/2009  . Smokeless tobacco: Not on file  . Alcohol Use: No    No family history on file.   Review of Systems  Constitutional: Negative.   HENT: Negative.   Eyes: Negative.   Respiratory: Negative.   Cardiovascular: Negative.   Gastrointestinal: Negative.   Genitourinary: Negative.   Musculoskeletal: Positive for joint pain.  Skin: Negative.   Neurological: Negative.   Endo/Heme/Allergies: Negative.   Psychiatric/Behavioral: Negative.      Objective:  Physical Exam  Constitutional: She is oriented to person, place, and time. She appears well-developed and well-nourished.  HENT:  Head: Normocephalic and atraumatic.  Eyes: EOM are normal. Pupils are equal, round, and reactive to light.  Neck: Normal range of motion. Neck supple.  Cardiovascular: Normal rate and regular rhythm.  Exam reveals no gallop and no friction rub.   No murmur heard. Respiratory: Effort normal. No respiratory distress. She has no wheezes. She has no rales.  GI: Soft. Bowel sounds are normal. She exhibits no distension. There is no tenderness.  Neurological: She is alert and oriented to person, place, and time.  Skin: Skin is warm and dry.  Psychiatric: She has a normal mood and affect. Her behavior is normal. Judgment and thought content normal.    Vital signs in last 24 hours: @VSRANGES @  Labs:   Estimated body mass index is 36.61 kg/(m^2) as calculated from the following:   Height as of 06/04/14: 5\' 5"  (1.651 m).   Weight as of 06/04/14: 99.791 kg (220 lb).   Imaging Review Plain radiographs demonstrate severe degenerative joint disease of the right knee(s). The overall alignment ismild varus. The bone quality appears to be fair for age and reported activity level.  Assessment/Plan:  End stage arthritis, right knee   The patient history, physical examination, clinical judgment of the provider and imaging studies are consistent with end stage degenerative joint disease of the right knee(s) and total knee arthroplasty is deemed medically necessary. The treatment options including medical management, injection therapy arthroscopy and arthroplasty were discussed at length. The  risks and benefits of total knee arthroplasty were presented and reviewed. The risks due to aseptic loosening, infection, stiffness, patella tracking problems, thromboembolic complications and other imponderables were discussed. The patient acknowledged the explanation,  agreed to proceed with the plan and consent was signed. Patient is being admitted for inpatient treatment for surgery, pain control, PT, OT, prophylactic antibiotics, VTE prophylaxis, progressive ambulation and ADL's and discharge planning. The patient is planning to be discharged home with home health services

## 2014-06-19 NOTE — Pre-Procedure Instructions (Signed)
Robin [name]  06/19/2014   Your procedure is scheduled on: Wednesday, July 03, 2014  [facility] at 6:30 AM.  Call this number if you have problems the morning of surgery: [phone]   Remember:   Do not eat food or drink liquids after midnight Tuesday, July 02, 2014   Take these medicines the morning of surgery with A SIP OF WATER: metroNIDAZOLE (FLAGYL),   omeprazole (PRILOSEC), if needed: Pain medication Stop taking Aspirin, vitamins and herbal medications. Do not take any NSAIDs ie: Ibuprofen, Advil, Naproxen or any medication containing Aspirin; stop Friday, June 28, 2014  Do not wear jewelry, make-up or nail polish.  Do not wear lotions, powders, or perfumes. You may wear deodorant.  Do not shave 48 hours prior to surgery.  Do not bring valuables to the [name].  [facility] responsible for any belongings or valuables.               Contacts, dentures or bridgework may not be worn into surgery.  Leave suitcase in the car. After surgery it may be brought to your room.  For patients admitted to the [name], discharge time is determined by your treatment team.               Patients discharged the day of surgery will not be allowed to drive home.  Name and phone number of your driver:   Special Instructions:  Special Instructions:Special Instructions: [facility] - Preparing for Surgery  Before surgery, you can play an important role.  Because skin is not sterile, your skin needs to be as free of germs as possible.  You can reduce the number of germs on you skin by washing with CHG (chlorahexidine gluconate) soap before surgery.  CHG is an antiseptic cleaner which kills germs and bonds with the skin to continue killing germs even after washing.  Please DO NOT use if you have an allergy to CHG or antibacterial soaps.  If your skin becomes reddened/irritated stop using the CHG and inform your nurse when you arrive at Short Stay.  Do not shave  (including legs and underarms) for at least 48 hours prior to the first CHG shower.  You may shave your face.  Please follow these instructions carefully:   1.  Shower with CHG Soap the night before surgery and the morning of Surgery.  2.  If you choose to wash your hair, wash your hair first as usual with your normal shampoo.  3.  After you shampoo, rinse your hair and body thoroughly to remove the Shampoo.  4.  Use CHG as you would any other liquid soap.  You can apply chg directly  to the skin and wash gently with scrungie or a clean washcloth.  5.  Apply the CHG Soap to your body ONLY FROM THE NECK DOWN.  Do not use on open wounds or open sores.  Avoid contact with your eyes, ears, mouth and genitals (private parts).  Wash genitals (private parts) with your normal soap.  6.  Wash thoroughly, paying special attention to the area where your surgery will be performed.  7.  Thoroughly rinse your body with warm water from the neck down.  8.  DO NOT shower/wash with your normal soap after using and rinsing off the CHG Soap.  9.  Pat yourself dry with a clean towel.            10.  Wear clean pajamas.  11.  Place clean sheets on your bed the night of your first shower and do not sleep with pets.  Day of Surgery  Do not apply any lotions the morning of surgery.  Please wear clean clothes to the [name]/surgery center.   Please read over the following fact sheets that you were given: Pain Booklet, Coughing and Deep Breathing, Blood Transfusion Information, Total Joint Packet, MRSA Information and Surgical Site Infection Prevention

## 2014-06-20 ENCOUNTER — Ambulatory Visit (HOSPITAL_COMMUNITY)
Admission: RE | Admit: 2014-06-20 | Discharge: 2014-06-20 | Disposition: A | Discharge status: Home / Self Care | Payer: Medicaid Other | Source: Ambulatory Visit | Attending: Physician Assistant | Admitting: Physician Assistant

## 2014-06-20 ENCOUNTER — Encounter (HOSPITAL_COMMUNITY)
Admission: RE | Admit: 2014-06-20 | Discharge: 2014-06-20 | Disposition: A | Discharge status: Home / Self Care | Payer: Medicaid Other | Source: Ambulatory Visit | Attending: Orthopedic Surgery | Admitting: Orthopedic Surgery

## 2014-06-20 ENCOUNTER — Encounter (HOSPITAL_COMMUNITY): Payer: Self-pay

## 2014-06-20 DIAGNOSIS — Z01818 Encounter for other preprocedural examination: Secondary | ICD-10-CM | POA: Insufficient documentation

## 2014-06-20 DIAGNOSIS — Z01810 Encounter for preprocedural cardiovascular examination: Secondary | ICD-10-CM | POA: Insufficient documentation

## 2014-06-20 HISTORY — DX: Headache: R51

## 2014-06-20 HISTORY — DX: Unspecified osteoarthritis, unspecified site: M19.90

## 2014-06-20 LAB — COMPREHENSIVE METABOLIC PANEL
ALBUMIN: 3.8 g/dL (ref 3.5–5.2)
ALT: 17 U/L (ref 0–35)
AST: 28 U/L (ref 0–37)
Alkaline Phosphatase: 99 U/L (ref 39–117)
BUN: 9 mg/dL (ref 6–23)
CALCIUM: 9.7 mg/dL (ref 8.4–10.5)
CO2: 25 mEq/L (ref 19–32)
Chloride: 100 mEq/L (ref 96–112)
Creatinine, Ser: 0.66 mg/dL (ref 0.50–1.10)
GFR calc Af Amer: >90 mL/min (ref >90)
Glucose, Bld: 141 mg/dL — ABNORMAL HIGH (ref 70–99)
Potassium: 4 mEq/L (ref 3.7–5.3)
SODIUM: 140 meq/L (ref 137–147)
TOTAL PROTEIN: 7.8 g/dL (ref 6.0–8.3)
Total Bilirubin: 0.2 mg/dL — ABNORMAL LOW (ref 0.3–1.2)

## 2014-06-20 LAB — URINE MICROSCOPIC-ADD ON

## 2014-06-20 LAB — CBC WITH DIFFERENTIAL/PLATELET
BASOS ABS: 0 10*3/uL (ref 0.0–0.1)
Basophils Relative: 0 % (ref 0–1)
EOS ABS: 0.1 10*3/uL (ref 0.0–0.7)
Eosinophils Relative: 1 % (ref 0–5)
HCT: 39.6 % (ref 36.0–46.0)
Hemoglobin: 12.4 g/dL (ref 12.0–15.0)
Lymphocytes Relative: 21 % (ref 12–46)
Lymphs Abs: 2.7 10*3/uL (ref 0.7–4.0)
MCH: 26.5 pg (ref 26.0–34.0)
MCHC: 31.3 g/dL (ref 30.0–36.0)
MCV: 84.6 fL (ref 78.0–100.0)
MONO ABS: 0.9 10*3/uL (ref 0.1–1.0)
Monocytes Relative: 7 % (ref 3–12)
Neutro Abs: 9.4 10*3/uL — ABNORMAL HIGH (ref 1.7–7.7)
Neutrophils Relative %: 71 % (ref 43–77)
PLATELETS: 258 10*3/uL (ref 150–400)
RBC: 4.68 MIL/uL (ref 3.87–5.11)
RDW: 16.1 % — AB (ref 11.5–15.5)
WBC: 13.3 10*3/uL — AB (ref 4.0–10.5)

## 2014-06-20 LAB — URINALYSIS, ROUTINE W REFLEX MICROSCOPIC
Bilirubin Urine: NEGATIVE
KETONES UR: NEGATIVE mg/dL
LEUKOCYTES UA: NEGATIVE
Nitrite: NEGATIVE
PROTEIN: NEGATIVE mg/dL
Specific Gravity, Urine: 1.028 (ref 1.005–1.030)
UROBILINOGEN UA: 0.2 mg/dL (ref 0.0–1.0)
pH: 5 (ref 5.0–8.0)

## 2014-06-20 LAB — TYPE AND SCREEN
ABO/RH(D): A NEG
ANTIBODY SCREEN: NEGATIVE

## 2014-06-20 LAB — APTT: aPTT: 32 seconds (ref 24–37)

## 2014-06-20 LAB — SURGICAL PCR SCREEN
MRSA, PCR: NEGATIVE
Staphylococcus aureus: POSITIVE — AB

## 2014-06-20 LAB — ABO/RH: ABO/RH(D): A NEG

## 2014-06-20 LAB — PROTIME-INR
INR: 1 (ref 0.00–1.49)
Prothrombin Time: 13.2 seconds (ref 11.6–15.2)

## 2014-06-20 NOTE — Pre-Procedure Instructions (Deleted)
Robin [name]  06/20/2014   Your procedure is scheduled on: Wednesday, July 03, 2014  [facility] at 6:30 AM.  Call this number if you have problems the morning of surgery: [phone]   Remember:   Do not eat food or drink liquids after midnight Tuesday, July 02, 2014   Take these medicines the morning of surgery with A SIP OF WATER: metroNIDAZOLE (FLAGYL),   omeprazole (PRILOSEC), if needed: Pain medication Stop taking Aspirin, vitamins and herbal medications. Do not take any NSAIDs ie: Ibuprofen, Advil, Naproxen or any medication containing Aspirin; stop Friday, June 28, 2014  Do not wear jewelry, make-up or nail polish.  Do not wear lotions, powders, or perfumes. You may wear deodorant.  Do not shave 48 hours prior to surgery.  Do not bring valuables to the hospital.  [facility] is not [facility] for any belongings or valuables.               Contacts, dentures or bridgework may not be worn into surgery.  Leave suitcase in the car. After surgery it may be brought to your room.  For patients admitted to the hospital, discharge time is determined by your treatment team.               Patients discharged the day of surgery will not be allowed to drive home.  Name and phone number of your driver:   Special Instructions:  Special Instructions:Special Instructions: [facility] - Preparing for [facility]  Before surgery, you can play an important role.  Because skin is not sterile, your skin needs to be as free of germs as possible.  You can reduce the number of germs on you skin by washing with CHG (chlorahexidine gluconate) soap before surgery.  CHG is an antiseptic cleaner which kills germs and bonds with the skin to continue killing germs even after washing.  Please DO NOT use if you have an allergy to CHG or antibacterial soaps.  If your skin becomes reddened/irritated stop using the CHG and inform your nurse when you arrive at Short Stay.  Do not shave  (including legs and underarms) for at least 48 hours prior to the first CHG shower.  You may shave your face.  Please follow these instructions carefully:   1.  Shower with CHG Soap the night before surgery and the morning of Surgery.  2.  If you choose to wash your hair, wash your hair first as usual with your normal shampoo.  3.  After you shampoo, rinse your hair and body thoroughly to remove the Shampoo.  4.  Use CHG as you would any other liquid soap.  You can apply chg directly  to the skin and wash gently with scrungie or a clean washcloth.  5.  Apply the CHG Soap to your body ONLY FROM THE NECK DOWN.  Do not use on open wounds or open sores.  Avoid contact with your eyes, ears, mouth and genitals (private parts).  Wash genitals (private parts) with your normal soap.  6.  Wash thoroughly, paying special attention to the area where your surgery will be performed.  7.  Thoroughly rinse your body with warm water from the neck down.  8.  DO NOT shower/wash with your normal soap after using and rinsing off the CHG Soap.  9.  Pat yourself dry with a clean towel.            10.  Wear clean pajamas.  11.  Place clean sheets on your bed the night of your first shower and do not sleep with pets.  Day of Surgery  Do not apply any lotions the morning of surgery.  Please wear clean clothes to the hospital/surgery center.   Please read over the following fact sheets that you were given: Pain Booklet, Coughing and Deep Breathing, Blood Transfusion Information, Total Joint Packet, MRSA Information and Surgical Site Infection Prevention

## 2014-06-20 NOTE — Progress Notes (Signed)
I called a  Message for Mupirocin ointment to [facility], [address], [place].

## 2014-06-21 LAB — URINE CULTURE: Colony Count: 5000

## 2014-06-21 NOTE — Progress Notes (Addendum)
Anesthesia chart review: Patient is a 48 year old female scheduled for right TKR 07/03/14 by Dr. Mckinley Jewelaniel Murphy.   History includes former smoker, hypertension, diabetes mellitus type 2, GERD, migraine headaches, arthritis, cholecystectomy. BMI is consistent with obesity. PCP is listed as Robin Bravoiffany Gibson, NP with [facility].  EKG on 06/20/14 showed ST at 105 bpm, LAD, poor precordial r wave progression, possible anterior infarct (age undetermined).  Currently there are no comparison EKGs available.   Chest x-ray on 06/20/14 showed no acute cardiopulmonary abnormality.  Preoperative labs noted. Urine culture is pending.   I've requested a comparison EKG from her PCP if available.  Nursing staff to have me review if one is received; otherwise clinical correlation on the day of surgery. No CV symptoms were documented from her PAT visit. (Update: Received last office note from Robin HansenJanice Daniel, NP. Last visit 06/14/14.  She was aware of plans for TKR. No EKG at her office.)  Robin Ochsllison Zelenak, PA-C [facility] Short [facility]/Anesthesiology Phone [phone] 06/21/2014 1:52 PM

## 2014-06-24 NOTE — Progress Notes (Signed)
Re-requested last office note and pcp office stated they did not have EKg on file.

## 2014-06-26 ENCOUNTER — Encounter (HOSPITAL_COMMUNITY): Payer: Self-pay | Admitting: Emergency Medicine

## 2014-06-26 ENCOUNTER — Emergency Department (HOSPITAL_COMMUNITY)
Admission: EM | Admit: 2014-06-26 | Discharge: 2014-06-26 | Disposition: A | Discharge status: Home / Self Care | Payer: Medicaid Other | Attending: Emergency Medicine | Admitting: Emergency Medicine

## 2014-06-26 DIAGNOSIS — R51 Headache: Secondary | ICD-10-CM | POA: Diagnosis present

## 2014-06-26 DIAGNOSIS — Z794 Long term (current) use of insulin: Secondary | ICD-10-CM | POA: Insufficient documentation

## 2014-06-26 DIAGNOSIS — Z87891 Personal history of nicotine dependence: Secondary | ICD-10-CM | POA: Insufficient documentation

## 2014-06-26 DIAGNOSIS — K219 Gastro-esophageal reflux disease without esophagitis: Secondary | ICD-10-CM | POA: Diagnosis not present

## 2014-06-26 DIAGNOSIS — Z7982 Long term (current) use of aspirin: Secondary | ICD-10-CM | POA: Diagnosis not present

## 2014-06-26 DIAGNOSIS — E119 Type 2 diabetes mellitus without complications: Secondary | ICD-10-CM | POA: Insufficient documentation

## 2014-06-26 DIAGNOSIS — G43009 Migraine without aura, not intractable, without status migrainosus: Secondary | ICD-10-CM

## 2014-06-26 DIAGNOSIS — G43909 Migraine, unspecified, not intractable, without status migrainosus: Secondary | ICD-10-CM | POA: Diagnosis not present

## 2014-06-26 DIAGNOSIS — I10 Essential (primary) hypertension: Secondary | ICD-10-CM | POA: Insufficient documentation

## 2014-06-26 DIAGNOSIS — M129 Arthropathy, unspecified: Secondary | ICD-10-CM | POA: Diagnosis not present

## 2014-06-26 DIAGNOSIS — Z79899 Other long term (current) drug therapy: Secondary | ICD-10-CM | POA: Diagnosis not present

## 2014-06-26 DIAGNOSIS — E669 Obesity, unspecified: Secondary | ICD-10-CM | POA: Insufficient documentation

## 2014-06-26 MED ORDER — SODIUM CHLORIDE 0.9 % IV BOLUS (SEPSIS)
1000.0000 mL | Freq: Once | INTRAVENOUS | Status: AC
  Administered 2014-06-26: 1000 mL via INTRAVENOUS

## 2014-06-26 MED ORDER — DEXAMETHASONE SODIUM PHOSPHATE 4 MG/ML IJ SOLN
10.0000 mg | Freq: Once | INTRAMUSCULAR | Status: AC
  Administered 2014-06-26: 10 mg via INTRAVENOUS
  Filled 2014-06-26: qty 3

## 2014-06-26 MED ORDER — DIPHENHYDRAMINE HCL 50 MG/ML IJ SOLN
25.0000 mg | Freq: Once | INTRAMUSCULAR | Status: AC
  Administered 2014-06-26: 25 mg via INTRAVENOUS
  Filled 2014-06-26: qty 1

## 2014-06-26 MED ORDER — KETOROLAC TROMETHAMINE 30 MG/ML IJ SOLN
30.0000 mg | Freq: Once | INTRAMUSCULAR | Status: AC
  Administered 2014-06-26: 30 mg via INTRAVENOUS
  Filled 2014-06-26: qty 1

## 2014-06-26 MED ORDER — METOCLOPRAMIDE HCL 5 MG/ML IJ SOLN
10.0000 mg | Freq: Once | INTRAMUSCULAR | Status: AC
  Administered 2014-06-26: 10 mg via INTRAVENOUS
  Filled 2014-06-26: qty 2

## 2014-06-26 NOTE — ED Provider Notes (Signed)
TIME SEEN: 8:32 PM  CHIEF COMPLAINT: Migraine headache  HPI: Patient is a 48 y.o. F with history of hypertension, diabetes, migraines who presents the emergency department with a migraine headache that started this morning. She describes it as a frontal throbbing pain without radiation. Pain is worse with light and sounds. Denies any alleviating factors. She states her last headache that was marked as was approximately one year ago. No head injury. She is not on anticoagulation. No fevers, neck pain or neck stiffness. No tick bite. No rash. No numbness, tingling or focal weakness. No thunderclap or sudden onset headache. No worst headache of her life.  ROS: See HPI Constitutional: no fever  Eyes: no drainage  ENT: no runny nose   Cardiovascular:  no chest pain  Resp: no SOB  GI: no vomiting GU: no dysuria Integumentary: no rash  Allergy: no hives  Musculoskeletal: no leg swelling  Neurological: no slurred speech ROS otherwise negative  PAST MEDICAL HISTORY/PAST SURGICAL HISTORY:  Past Medical History  Diagnosis Date  . Hypertension   . Diabetes mellitus   . GERD (gastroesophageal reflux disease)   . Obesity   . Headache(784.0)     hx migraine  . Arthritis     MEDICATIONS:  Prior to Admission medications   Medication Sig Start Date End Date Taking? Authorizing Provider  aspirin-acetaminophen-caffeine (EXCEDRIN MIGRAINE) [phone] MG per tablet Take 2 tablets by mouth every 6 (six) hours as needed for headache.   Yes Historical Provider, MD  Canagliflozin-Metformin HCl (INVOKAMET) 50-1000 MG TABS Take 1 tablet by mouth 2 (two) times daily before a meal.   Yes Historical Provider, MD  insulin glargine (LANTUS) 100 UNIT/ML injection Inject 50 Units into the skin 2 (two) times daily as needed (low blood sugar).   Yes Historical Provider, MD  lisinopril-hydrochlorothiazide (PRINZIDE,ZESTORETIC) 20-12.5 MG per tablet Take 1 tablet by mouth daily.   Yes Historical Provider, MD   omeprazole (PRILOSEC) 10 MG capsule Take 10 mg by mouth daily.   Yes Historical Provider, MD  albuterol (PROVENTIL HFA;VENTOLIN HFA) 108 (90 BASE) MCG/ACT inhaler Inhale 2 puffs into the lungs every 6 (six) hours as needed. For shortness of breath    Historical Provider, MD    ALLERGIES:  Allergies  Allergen Reactions  . Sulfa Antibiotics Hives    SOCIAL HISTORY:  History  Substance Use Topics  . Smoking status: Former Smoker -- 0.50 packs/day for 9 years    Quit date: 12/27/2009  . Smokeless tobacco: Never Used  . Alcohol Use: No    FAMILY HISTORY: No family history on file.  EXAM: BP 143/96  Pulse 77  Temp(Src) 98 F (36.7 C) (Oral)  Resp 16  Ht 5\' 5"  (1.651 m)  Wt 224 lb (101.606 kg)  BMI 37.28 kg/m2  SpO2 96%  LMP 06/20/2014 CONSTITUTIONAL: Alert and oriented and responds appropriately to questions. Well-appearing; well-nourished, patient appears uncomfortable but nontoxic HEAD: Normocephalic EYES: Conjunctivae clear, PERRL, patient has photophobia ENT: normal nose; no rhinorrhea; moist mucous membranes; pharynx without lesions noted NECK: Supple, no meningismus, no LAD  CARD: RRR; S1 and S2 appreciated; no murmurs, no clicks, no rubs, no gallops RESP: Normal chest excursion without splinting or tachypnea; breath sounds clear and equal bilaterally; no wheezes, no rhonchi, no rales,  ABD/GI: Normal bowel sounds; non-distended; soft, non-tender, no rebound, no guarding BACK:  The back appears normal and is non-tender to palpation, there is no CVA tenderness EXT: Normal ROM in all joints; non-tender to palpation; no edema; normal  capillary refill; no cyanosis    SKIN: Normal color for age and race; warm NEURO: Moves all extremities equally, sensation to light touch intact diffusely, cranial nerves II through XII intact PSYCH: The patient's mood and manner are appropriate. Grooming and personal hygiene are appropriate.  MEDICAL DECISION MAKING: Patient here with  typical migraine headache. We'll give Toradol, Reglan, Benadryl, Decadron and IV fluids and reassess. I am not concerned for meningitis or encephalitis, intracranial hemorrhage, infarct, cavernous sinus thrombosis at this time.  ED PROGRESS: When I went back to reassess the patient, she states she is feeling better and her headache has improved. She states it is still a 7/10 and is asking for a medication that "starts with D". Patient's daughter thinks that she was given a narcotic pain medication for her headache. Have discussed with patient and family that I do not give narcotics for migraine headaches given that there is literature that shows that they make her headaches worse, more frequent and difficult to treat in the future. Have offered other medications for patient's headache but she declined stating that she feels better and feels that she is ready to be discharged home. Have discussed supportive care instructions and return precautions. Patient verbalizes understanding and is comfortable with this plan.     Layla MawKristen N Ward, DO 06/26/14 2343

## 2014-06-26 NOTE — ED Notes (Signed)
I have a headache that has been bothering me since this morning. I had a migraine around 1 year ago, no other history of migraines per pt. Light bothering me, nausea and vomiting per pt.

## 2014-06-26 NOTE — Discharge Instructions (Signed)

## 2014-07-02 MED ORDER — CEFAZOLIN SODIUM-DEXTROSE 2-3 GM-% IV SOLR
2.0000 g | INTRAVENOUS | Status: AC
Start: 2014-07-03 — End: 2014-07-03
  Administered 2014-07-03: 2 g via INTRAVENOUS
  Filled 2014-07-02: qty 50

## 2014-07-02 MED ORDER — CHLORHEXIDINE GLUCONATE 4 % EX LIQD
60.0000 mL | Freq: Once | CUTANEOUS | Status: DC
  Filled 2014-07-02: qty 60

## 2014-07-02 MED ORDER — LACTATED RINGERS IV SOLN
INTRAVENOUS | Status: DC
  Administered 2014-07-03: 12:00:00 via INTRAVENOUS

## 2014-07-03 ENCOUNTER — Encounter (HOSPITAL_COMMUNITY): Payer: Self-pay | Admitting: Surgery

## 2014-07-03 ENCOUNTER — Inpatient Hospital Stay (HOSPITAL_COMMUNITY): Payer: Medicaid Other

## 2014-07-03 ENCOUNTER — Encounter (HOSPITAL_COMMUNITY)
Admission: RE | Disposition: A | Discharge status: Home Health | Payer: Self-pay | Source: Ambulatory Visit | Attending: Orthopedic Surgery

## 2014-07-03 ENCOUNTER — Encounter (HOSPITAL_COMMUNITY): Payer: Medicaid Other | Admitting: Vascular Surgery

## 2014-07-03 ENCOUNTER — Inpatient Hospital Stay (HOSPITAL_COMMUNITY)
Admission: RE | Admit: 2014-07-03 | Discharge: 2014-07-05 | DRG: 470 | Disposition: A | Discharge status: Home Health | Payer: Medicaid Other | Source: Ambulatory Visit | Attending: Orthopedic Surgery | Admitting: Orthopedic Surgery

## 2014-07-03 ENCOUNTER — Inpatient Hospital Stay (HOSPITAL_COMMUNITY): Payer: Medicaid Other | Admitting: Anesthesiology

## 2014-07-03 DIAGNOSIS — K219 Gastro-esophageal reflux disease without esophagitis: Secondary | ICD-10-CM | POA: Diagnosis present

## 2014-07-03 DIAGNOSIS — Z9089 Acquired absence of other organs: Secondary | ICD-10-CM

## 2014-07-03 DIAGNOSIS — Z7982 Long term (current) use of aspirin: Secondary | ICD-10-CM

## 2014-07-03 DIAGNOSIS — Z87891 Personal history of nicotine dependence: Secondary | ICD-10-CM

## 2014-07-03 DIAGNOSIS — D62 Acute posthemorrhagic anemia: Secondary | ICD-10-CM | POA: Diagnosis not present

## 2014-07-03 DIAGNOSIS — M179 Osteoarthritis of knee, unspecified: Secondary | ICD-10-CM | POA: Diagnosis present

## 2014-07-03 DIAGNOSIS — M25569 Pain in unspecified knee: Secondary | ICD-10-CM | POA: Diagnosis present

## 2014-07-03 DIAGNOSIS — I10 Essential (primary) hypertension: Secondary | ICD-10-CM | POA: Diagnosis present

## 2014-07-03 DIAGNOSIS — Z794 Long term (current) use of insulin: Secondary | ICD-10-CM | POA: Diagnosis not present

## 2014-07-03 DIAGNOSIS — M1710 Unilateral primary osteoarthritis, unspecified knee: Secondary | ICD-10-CM | POA: Diagnosis present

## 2014-07-03 DIAGNOSIS — M1711 Unilateral primary osteoarthritis, right knee: Secondary | ICD-10-CM

## 2014-07-03 DIAGNOSIS — Z882 Allergy status to sulfonamides status: Secondary | ICD-10-CM | POA: Diagnosis not present

## 2014-07-03 DIAGNOSIS — Z6836 Body mass index (BMI) 36.0-36.9, adult: Secondary | ICD-10-CM | POA: Diagnosis not present

## 2014-07-03 DIAGNOSIS — R110 Nausea: Secondary | ICD-10-CM | POA: Diagnosis not present

## 2014-07-03 DIAGNOSIS — E119 Type 2 diabetes mellitus without complications: Secondary | ICD-10-CM | POA: Diagnosis present

## 2014-07-03 DIAGNOSIS — E669 Obesity, unspecified: Secondary | ICD-10-CM | POA: Diagnosis present

## 2014-07-03 DIAGNOSIS — Z79899 Other long term (current) drug therapy: Secondary | ICD-10-CM | POA: Diagnosis not present

## 2014-07-03 HISTORY — PX: TOTAL KNEE ARTHROPLASTY: SHX125

## 2014-07-03 LAB — GLUCOSE, CAPILLARY
GLUCOSE-CAPILLARY: 192 mg/dL — AB (ref 70–99)
GLUCOSE-CAPILLARY: 185 mg/dL — AB (ref 70–99)
Glucose-Capillary: 213 mg/dL — ABNORMAL HIGH (ref 70–99)
Glucose-Capillary: 238 mg/dL — ABNORMAL HIGH (ref 70–99)

## 2014-07-03 SURGERY — ARTHROPLASTY, KNEE, TOTAL
Anesthesia: General | Site: Knee | Laterality: Right

## 2014-07-03 MED ORDER — LABETALOL HCL 5 MG/ML IV SOLN
INTRAVENOUS | Status: DC | PRN
  Administered 2014-07-03: 5 mg via INTRAVENOUS
  Administered 2014-07-03: 2.5 mg via INTRAVENOUS
  Administered 2014-07-03: 5 mg via INTRAVENOUS
  Administered 2014-07-03 (×3): 2.5 mg via INTRAVENOUS

## 2014-07-03 MED ORDER — DEXAMETHASONE SODIUM PHOSPHATE 4 MG/ML IJ SOLN
INTRAMUSCULAR | Status: DC | PRN
  Administered 2014-07-03: 4 mg via INTRAVENOUS

## 2014-07-03 MED ORDER — ROCURONIUM BROMIDE 50 MG/5ML IV SOLN
INTRAVENOUS | Status: AC
  Filled 2014-07-03: qty 1

## 2014-07-03 MED ORDER — SODIUM CHLORIDE 0.9 % IJ SOLN
INTRAMUSCULAR | Status: DC | PRN
  Administered 2014-07-03: 40 mL

## 2014-07-03 MED ORDER — ONDANSETRON HCL 4 MG/2ML IJ SOLN
INTRAMUSCULAR | Status: DC | PRN
  Administered 2014-07-03: 4 mg via INTRAVENOUS

## 2014-07-03 MED ORDER — DEXAMETHASONE SODIUM PHOSPHATE 4 MG/ML IJ SOLN
INTRAMUSCULAR | Status: AC
  Filled 2014-07-03: qty 1

## 2014-07-03 MED ORDER — OXYCODONE HCL 5 MG/5ML PO SOLN: 5.0000 mg | Freq: Once | ORAL | Status: AC | PRN

## 2014-07-03 MED ORDER — HYDROCHLOROTHIAZIDE 12.5 MG PO CAPS
12.5000 mg | ORAL_CAPSULE | Freq: Every day | ORAL | Status: DC
  Administered 2014-07-03 – 2014-07-05 (×3): 12.5 mg via ORAL
  Filled 2014-07-03 (×4): qty 1

## 2014-07-03 MED ORDER — ASPIRIN EC 325 MG PO TBEC
325.0000 mg | DELAYED_RELEASE_TABLET | Freq: Every day | ORAL | Status: DC
  Administered 2014-07-04 – 2014-07-05 (×2): 325 mg via ORAL
  Filled 2014-07-03 (×3): qty 1

## 2014-07-03 MED ORDER — FENTANYL CITRATE 0.05 MG/ML IJ SOLN
INTRAMUSCULAR | Status: DC | PRN
  Administered 2014-07-03: 100 ug via INTRAVENOUS
  Administered 2014-07-03: 150 ug via INTRAVENOUS

## 2014-07-03 MED ORDER — METOCLOPRAMIDE HCL 5 MG/ML IJ SOLN
INTRAMUSCULAR | Status: AC
  Filled 2014-07-03: qty 2

## 2014-07-03 MED ORDER — EPHEDRINE SULFATE 50 MG/ML IJ SOLN
INTRAMUSCULAR | Status: AC
  Filled 2014-07-03: qty 1

## 2014-07-03 MED ORDER — DIPHENHYDRAMINE HCL 12.5 MG/5ML PO ELIX: 12.5000 mg | ORAL_SOLUTION | ORAL | Status: DC | PRN

## 2014-07-03 MED ORDER — SUCCINYLCHOLINE CHLORIDE 20 MG/ML IJ SOLN
INTRAMUSCULAR | Status: AC
  Filled 2014-07-03: qty 2

## 2014-07-03 MED ORDER — ONDANSETRON HCL 4 MG PO TABS: 4.0000 mg | ORAL_TABLET | Freq: Three times a day (TID) | ORAL | Status: DC | PRN

## 2014-07-03 MED ORDER — HYDROMORPHONE HCL PF 1 MG/ML IJ SOLN
INTRAMUSCULAR | Status: AC
  Filled 2014-07-03: qty 1

## 2014-07-03 MED ORDER — FENTANYL CITRATE 0.05 MG/ML IJ SOLN
INTRAMUSCULAR | Status: AC
  Filled 2014-07-03: qty 5

## 2014-07-03 MED ORDER — METHOCARBAMOL 500 MG PO TABS: 500.0000 mg | ORAL_TABLET | Freq: Four times a day (QID) | ORAL | Status: DC

## 2014-07-03 MED ORDER — ACETAMINOPHEN 650 MG RE SUPP: 650.0000 mg | Freq: Four times a day (QID) | RECTAL | Status: DC | PRN

## 2014-07-03 MED ORDER — OXYCODONE HCL 5 MG PO TABS
5.0000 mg | ORAL_TABLET | ORAL | Status: DC | PRN
  Administered 2014-07-03 – 2014-07-04 (×4): 10 mg via ORAL
  Administered 2014-07-04: 5 mg via ORAL
  Administered 2014-07-04 – 2014-07-05 (×6): 10 mg via ORAL
  Filled 2014-07-03 (×11): qty 2

## 2014-07-03 MED ORDER — OXYCODONE-ACETAMINOPHEN 5-325 MG PO TABS: 1.0000 | ORAL_TABLET | ORAL | Status: DC | PRN

## 2014-07-03 MED ORDER — BUPIVACAINE HCL (PF) 0.25 % IJ SOLN
INTRAMUSCULAR | Status: DC | PRN
  Administered 2014-07-03: 10 mL

## 2014-07-03 MED ORDER — ACETAMINOPHEN 325 MG PO TABS
650.0000 mg | ORAL_TABLET | Freq: Four times a day (QID) | ORAL | Status: DC | PRN
  Administered 2014-07-04: 650 mg via ORAL
  Filled 2014-07-03: qty 2

## 2014-07-03 MED ORDER — BUPIVACAINE HCL (PF) 0.25 % IJ SOLN
INTRAMUSCULAR | Status: AC
  Filled 2014-07-03: qty 30

## 2014-07-03 MED ORDER — POTASSIUM CHLORIDE IN NACL 20-0.9 MEQ/L-% IV SOLN
INTRAVENOUS | Status: DC
  Administered 2014-07-03: 19:00:00 via INTRAVENOUS
  Filled 2014-07-03 (×3): qty 1000

## 2014-07-03 MED ORDER — ONDANSETRON HCL 4 MG/2ML IJ SOLN
4.0000 mg | Freq: Four times a day (QID) | INTRAMUSCULAR | Status: DC | PRN
  Administered 2014-07-03 – 2014-07-04 (×2): 4 mg via INTRAVENOUS
  Filled 2014-07-03 (×2): qty 2

## 2014-07-03 MED ORDER — STERILE WATER FOR INJECTION IJ SOLN
INTRAMUSCULAR | Status: AC
  Filled 2014-07-03: qty 10

## 2014-07-03 MED ORDER — ASPIRIN EC 325 MG PO TBEC: 325.0000 mg | DELAYED_RELEASE_TABLET | Freq: Every day | ORAL | Status: DC

## 2014-07-03 MED ORDER — INSULIN ASPART 100 UNIT/ML ~~LOC~~ SOLN: 0.0000 [IU] | Freq: Every day | SUBCUTANEOUS | Status: DC

## 2014-07-03 MED ORDER — PROPOFOL 10 MG/ML IV BOLUS
INTRAVENOUS | Status: AC
  Filled 2014-07-03: qty 20

## 2014-07-03 MED ORDER — CANAGLIFLOZIN-METFORMIN HCL 50-1000 MG PO TABS: 1.0000 | ORAL_TABLET | Freq: Two times a day (BID) | ORAL | Status: DC

## 2014-07-03 MED ORDER — MENTHOL 3 MG MT LOZG: 1.0000 | LOZENGE | OROMUCOSAL | Status: DC | PRN

## 2014-07-03 MED ORDER — HYDROMORPHONE HCL PF 1 MG/ML IJ SOLN
0.2500 mg | INTRAMUSCULAR | Status: DC | PRN
  Administered 2014-07-03 (×4): 0.5 mg via INTRAVENOUS

## 2014-07-03 MED ORDER — BISACODYL 5 MG PO TBEC: 5.0000 mg | DELAYED_RELEASE_TABLET | Freq: Every day | ORAL | Status: DC | PRN

## 2014-07-03 MED ORDER — OXYCODONE HCL 5 MG PO TABS
ORAL_TABLET | ORAL | Status: AC
  Filled 2014-07-03: qty 1

## 2014-07-03 MED ORDER — LISINOPRIL 20 MG PO TABS
20.0000 mg | ORAL_TABLET | Freq: Every day | ORAL | Status: DC
  Administered 2014-07-03 – 2014-07-05 (×3): 20 mg via ORAL
  Filled 2014-07-03 (×4): qty 1

## 2014-07-03 MED ORDER — METOCLOPRAMIDE HCL 5 MG/ML IJ SOLN
INTRAMUSCULAR | Status: DC | PRN
  Administered 2014-07-03: 10 mg via INTRAVENOUS

## 2014-07-03 MED ORDER — LIDOCAINE HCL (CARDIAC) 20 MG/ML IV SOLN
INTRAVENOUS | Status: DC | PRN
  Administered 2014-07-03: 100 mg via INTRAVENOUS

## 2014-07-03 MED ORDER — MIDAZOLAM HCL 5 MG/5ML IJ SOLN
INTRAMUSCULAR | Status: DC | PRN
  Administered 2014-07-03: 2 mg via INTRAVENOUS

## 2014-07-03 MED ORDER — METOCLOPRAMIDE HCL 10 MG PO TABS: 5.0000 mg | ORAL_TABLET | Freq: Three times a day (TID) | ORAL | Status: DC | PRN

## 2014-07-03 MED ORDER — INSULIN ASPART 100 UNIT/ML ~~LOC~~ SOLN
0.0000 [IU] | Freq: Three times a day (TID) | SUBCUTANEOUS | Status: DC
  Administered 2014-07-03: 3 [IU] via SUBCUTANEOUS
  Administered 2014-07-04 – 2014-07-05 (×4): 2 [IU] via SUBCUTANEOUS
  Administered 2014-07-05: 3 [IU] via SUBCUTANEOUS

## 2014-07-03 MED ORDER — DOCUSATE SODIUM 100 MG PO CAPS
100.0000 mg | ORAL_CAPSULE | Freq: Two times a day (BID) | ORAL | Status: DC
  Administered 2014-07-04 – 2014-07-05 (×3): 100 mg via ORAL
  Filled 2014-07-03 (×6): qty 1

## 2014-07-03 MED ORDER — PROPOFOL 10 MG/ML IV BOLUS
INTRAVENOUS | Status: DC | PRN
  Administered 2014-07-03: 200 mg via INTRAVENOUS

## 2014-07-03 MED ORDER — DEXTROSE 5 % IV SOLN
500.0000 mg | Freq: Four times a day (QID) | INTRAVENOUS | Status: DC | PRN
  Filled 2014-07-03: qty 5

## 2014-07-03 MED ORDER — ONDANSETRON HCL 4 MG/2ML IJ SOLN
INTRAMUSCULAR | Status: AC
  Filled 2014-07-03: qty 2

## 2014-07-03 MED ORDER — LACTATED RINGERS IV SOLN
INTRAVENOUS | Status: DC | PRN
  Administered 2014-07-03: 08:00:00 via INTRAVENOUS

## 2014-07-03 MED ORDER — ALBUTEROL SULFATE (2.5 MG/3ML) 0.083% IN NEBU: 2.5000 mg | INHALATION_SOLUTION | Freq: Four times a day (QID) | RESPIRATORY_TRACT | Status: DC | PRN

## 2014-07-03 MED ORDER — SODIUM CHLORIDE 0.9 % IR SOLN
Status: DC | PRN
  Administered 2014-07-03: 1000 mL

## 2014-07-03 MED ORDER — METHOCARBAMOL 500 MG PO TABS
ORAL_TABLET | ORAL | Status: AC
  Filled 2014-07-03: qty 1

## 2014-07-03 MED ORDER — METOCLOPRAMIDE HCL 5 MG/ML IJ SOLN
5.0000 mg | Freq: Three times a day (TID) | INTRAMUSCULAR | Status: DC | PRN
Start: 2014-07-03 — End: 2014-07-05
  Administered 2014-07-03: 10 mg via INTRAVENOUS
  Filled 2014-07-03: qty 2

## 2014-07-03 MED ORDER — OXYCODONE HCL 5 MG PO TABS
5.0000 mg | ORAL_TABLET | Freq: Once | ORAL | Status: AC | PRN
  Administered 2014-07-03: 5 mg via ORAL

## 2014-07-03 MED ORDER — METFORMIN HCL 500 MG PO TABS
1000.0000 mg | ORAL_TABLET | Freq: Two times a day (BID) | ORAL | Status: DC
  Administered 2014-07-04 – 2014-07-05 (×3): 1000 mg via ORAL
  Filled 2014-07-03 (×6): qty 2

## 2014-07-03 MED ORDER — PHENOL 1.4 % MT LIQD: 1.0000 | OROMUCOSAL | Status: DC | PRN

## 2014-07-03 MED ORDER — LABETALOL HCL 5 MG/ML IV SOLN
INTRAVENOUS | Status: AC
  Filled 2014-07-03: qty 4

## 2014-07-03 MED ORDER — MIDAZOLAM HCL 2 MG/2ML IJ SOLN
INTRAMUSCULAR | Status: AC
  Filled 2014-07-03: qty 2

## 2014-07-03 MED ORDER — BUPIVACAINE LIPOSOME 1.3 % IJ SUSP
INTRAMUSCULAR | Status: DC | PRN
  Administered 2014-07-03: 20 mL

## 2014-07-03 MED ORDER — CEFAZOLIN SODIUM-DEXTROSE 2-3 GM-% IV SOLR
2.0000 g | Freq: Four times a day (QID) | INTRAVENOUS | Status: AC
  Administered 2014-07-03 (×2): 2 g via INTRAVENOUS
  Filled 2014-07-03 (×2): qty 50

## 2014-07-03 MED ORDER — SUCCINYLCHOLINE CHLORIDE 20 MG/ML IJ SOLN
INTRAMUSCULAR | Status: DC | PRN
  Administered 2014-07-03: 140 mg via INTRAVENOUS

## 2014-07-03 MED ORDER — LIDOCAINE HCL (CARDIAC) 20 MG/ML IV SOLN
INTRAVENOUS | Status: AC
  Filled 2014-07-03: qty 5

## 2014-07-03 MED ORDER — HYDROMORPHONE HCL PF 1 MG/ML IJ SOLN
INTRAMUSCULAR | Status: AC
  Administered 2014-07-03: 0.5 mg via INTRAVENOUS
  Filled 2014-07-03: qty 1

## 2014-07-03 MED ORDER — ONDANSETRON HCL 4 MG PO TABS: 4.0000 mg | ORAL_TABLET | Freq: Four times a day (QID) | ORAL | Status: DC | PRN

## 2014-07-03 MED ORDER — METOCLOPRAMIDE HCL 5 MG/ML IJ SOLN
10.0000 mg | Freq: Once | INTRAMUSCULAR | Status: AC | PRN
  Administered 2014-07-03: 10 mg via INTRAVENOUS

## 2014-07-03 MED ORDER — HYDROMORPHONE HCL PF 1 MG/ML IJ SOLN
0.5000 mg | INTRAMUSCULAR | Status: DC | PRN
  Administered 2014-07-03 – 2014-07-05 (×9): 1 mg via INTRAVENOUS
  Filled 2014-07-03 (×9): qty 1

## 2014-07-03 MED ORDER — METHOCARBAMOL 500 MG PO TABS
500.0000 mg | ORAL_TABLET | Freq: Four times a day (QID) | ORAL | Status: DC | PRN
  Administered 2014-07-03 – 2014-07-05 (×7): 500 mg via ORAL
  Filled 2014-07-03 (×8): qty 1

## 2014-07-03 MED ORDER — LISINOPRIL-HYDROCHLOROTHIAZIDE 20-12.5 MG PO TABS: 1.0000 | ORAL_TABLET | Freq: Every day | ORAL | Status: DC

## 2014-07-03 MED ORDER — CANAGLIFLOZIN 100 MG PO TABS
50.0000 mg | ORAL_TABLET | Freq: Two times a day (BID) | ORAL | Status: DC
  Administered 2014-07-04 – 2014-07-05 (×3): 50 mg via ORAL
  Filled 2014-07-03 (×6): qty 0.5

## 2014-07-03 MED ORDER — PANTOPRAZOLE SODIUM 40 MG PO TBEC
40.0000 mg | DELAYED_RELEASE_TABLET | Freq: Every day | ORAL | Status: DC
  Administered 2014-07-04 – 2014-07-05 (×2): 40 mg via ORAL
  Filled 2014-07-03: qty 1

## 2014-07-03 MED ORDER — BUPIVACAINE LIPOSOME 1.3 % IJ SUSP
20.0000 mL | Freq: Once | INTRAMUSCULAR | Status: DC
  Filled 2014-07-03: qty 20

## 2014-07-03 SURGICAL SUPPLY — 64 items
BANDAGE ELASTIC 4 VELCRO ST LF (GAUZE/BANDAGES/DRESSINGS) ×3 IMPLANT
BANDAGE ELASTIC 6 VELCRO ST LF (GAUZE/BANDAGES/DRESSINGS) ×3 IMPLANT
BANDAGE ESMARK 6X9 LF (GAUZE/BANDAGES/DRESSINGS) ×1 IMPLANT
BENZOIN TINCTURE PRP APPL 2/3 (GAUZE/BANDAGES/DRESSINGS) ×3 IMPLANT
BLADE SAG 18X100X1.27 (BLADE) ×6 IMPLANT
BNDG ESMARK 6X9 LF (GAUZE/BANDAGES/DRESSINGS) ×3
BOWL SMART MIX CTS (DISPOSABLE) ×3 IMPLANT
CEMENT BONE SIMPLEX SPEEDSET (Cement) ×6 IMPLANT
CLOSURE STERI-STRIP 1/2X4 (GAUZE/BANDAGES/DRESSINGS) ×1
CLOSURE WOUND 1/2 X4 (GAUZE/BANDAGES/DRESSINGS) ×2
CLSR STERI-STRIP ANTIMIC 1/2X4 (GAUZE/BANDAGES/DRESSINGS) ×2 IMPLANT
COVER SURGICAL LIGHT HANDLE (MISCELLANEOUS) ×3 IMPLANT
CUFF TOURNIQUET SINGLE 34IN LL (TOURNIQUET CUFF) ×3 IMPLANT
DRAPE EXTREMITY T 121X128X90 (DRAPE) ×3 IMPLANT
DRAPE PROXIMA HALF (DRAPES) ×3 IMPLANT
DRAPE U-SHAPE 47X51 STRL (DRAPES) ×3 IMPLANT
DRSG PAD ABDOMINAL 8X10 ST (GAUZE/BANDAGES/DRESSINGS) ×3 IMPLANT
DURAPREP 26ML APPLICATOR (WOUND CARE) ×6 IMPLANT
ELECT CAUTERY BLADE 6.4 (BLADE) ×3 IMPLANT
ELECT REM PT RETURN 9FT ADLT (ELECTROSURGICAL) ×3
ELECTRODE REM PT RTRN 9FT ADLT (ELECTROSURGICAL) ×1 IMPLANT
EVACUATOR 1/8 PVC DRAIN (DRAIN) ×3 IMPLANT
FACESHIELD WRAPAROUND (MASK) ×6 IMPLANT
GLOVE BIOGEL PI IND STRL 7.0 (GLOVE) ×2 IMPLANT
GLOVE BIOGEL PI INDICATOR 7.0 (GLOVE) ×4
GLOVE ECLIPSE 6.5 STRL STRAW (GLOVE) ×6 IMPLANT
GLOVE ORTHO TXT STRL SZ7.5 (GLOVE) ×3 IMPLANT
GOWN STRL REUS W/ TWL LRG LVL3 (GOWN DISPOSABLE) ×1 IMPLANT
GOWN STRL REUS W/ TWL XL LVL3 (GOWN DISPOSABLE) ×1 IMPLANT
GOWN STRL REUS W/TWL LRG LVL3 (GOWN DISPOSABLE) ×2
GOWN STRL REUS W/TWL XL LVL3 (GOWN DISPOSABLE) ×2
HANDPIECE INTERPULSE COAX TIP (DISPOSABLE) ×2
IMMOBILIZER KNEE 22 UNIV (SOFTGOODS) ×3 IMPLANT
IMMOBILIZER KNEE 24 THIGH 36 (MISCELLANEOUS) IMPLANT
IMMOBILIZER KNEE 24 UNIV (MISCELLANEOUS)
KIT BASIN OR (CUSTOM PROCEDURE TRAY) ×3 IMPLANT
KIT ROOM TURNOVER OR (KITS) ×3 IMPLANT
KNEE/VIT E POLY LINER LEVEL 1B ×3 IMPLANT
MANIFOLD NEPTUNE II (INSTRUMENTS) ×3 IMPLANT
NEEDLE 18GX1X1/2 (RX/OR ONLY) (NEEDLE) ×3 IMPLANT
NEEDLE 25GX 5/8IN NON SAFETY (NEEDLE) ×3 IMPLANT
NS IRRIG 1000ML POUR BTL (IV SOLUTION) ×3 IMPLANT
PACK TOTAL JOINT (CUSTOM PROCEDURE TRAY) ×3 IMPLANT
PAD ARMBOARD 7.5X6 YLW CONV (MISCELLANEOUS) ×6 IMPLANT
PAD CAST 4YDX4 CTTN HI CHSV (CAST SUPPLIES) ×1 IMPLANT
PADDING CAST COTTON 4X4 STRL (CAST SUPPLIES) ×2
PADDING CAST COTTON 6X4 STRL (CAST SUPPLIES) ×3 IMPLANT
SET HNDPC FAN SPRY TIP SCT (DISPOSABLE) ×1 IMPLANT
SPONGE GAUZE 4X4 12PLY (GAUZE/BANDAGES/DRESSINGS) ×3 IMPLANT
SPONGE GAUZE 4X4 12PLY STER LF (GAUZE/BANDAGES/DRESSINGS) ×3 IMPLANT
STRIP CLOSURE SKIN 1/2X4 (GAUZE/BANDAGES/DRESSINGS) ×4 IMPLANT
SUCTION FRAZIER TIP 10 FR DISP (SUCTIONS) ×3 IMPLANT
SUT MNCRL AB 4-0 PS2 18 (SUTURE) ×3 IMPLANT
SUT VIC AB 0 CT1 27 (SUTURE)
SUT VIC AB 0 CT1 27XBRD ANBCTR (SUTURE) IMPLANT
SUT VIC AB 1 CT1 27 (SUTURE) ×4
SUT VIC AB 1 CT1 27XBRD ANBCTR (SUTURE) ×2 IMPLANT
SUT VIC AB 2-0 CT1 27 (SUTURE) ×4
SUT VIC AB 2-0 CT1 TAPERPNT 27 (SUTURE) ×2 IMPLANT
SYR 50ML LL SCALE MARK (SYRINGE) ×3 IMPLANT
SYR CONTROL 10ML LL (SYRINGE) ×3 IMPLANT
TOWEL OR 17X24 6PK STRL BLUE (TOWEL DISPOSABLE) ×3 IMPLANT
TOWEL OR 17X26 10 PK STRL BLUE (TOWEL DISPOSABLE) ×3 IMPLANT
WATER STERILE IRR 1000ML POUR (IV SOLUTION) ×6 IMPLANT

## 2014-07-03 NOTE — Interval H&P Note (Signed)
History and Physical Interval Note:  07/03/2014 8:27 AM  Robin [name]  has presented today for surgery, with the diagnosis of djd right knee  The various methods of treatment have been discussed with the patient and family. After consideration of risks, benefits and other options for treatment, the patient has consented to  Procedure(s): TOTAL KNEE ARTHROPLASTY (Right) as a surgical intervention .  The patient's history has been reviewed, patient examined, no change in status, stable for surgery.  I have reviewed the patient's chart and labs.  Questions were answered to the patient's satisfaction.     [name] F

## 2014-07-03 NOTE — Progress Notes (Signed)
Orthopedic Tech Progress Note Patient Details:  Robin [name] 25-Sep-1966 [id number]  CPM Right Knee CPM Right Knee: On Right Knee Flexion (Degrees): 60 Right Knee Extension (Degrees): 0 Additional Comments: Trapeze bar and foot roll   Cammer, Mickie BailJennifer Carol 07/03/2014, 12:40 PM

## 2014-07-03 NOTE — H&P (View-Only) (Signed)
TOTAL KNEE ADMISSION H&P  Patient is being admitted for right total knee arthroplasty.  Subjective:  Chief Complaint:right knee pain.  HPI: Robin [name], 48 y.o. female, has a history of pain and functional disability in the right knee due to arthritis and has failed non-surgical conservative treatments for greater than 12 weeks to includeNSAID's and/or analgesics, corticosteriod injections and activity modification.  Onset of symptoms was gradual, starting 3 years ago with gradually worsening course since that time. The patient noted no past surgery on the right knee(s).  Patient currently rates pain in the right knee(s) at 10 out of 10 with activity. Patient has night pain, worsening of pain with activity and weight bearing, pain that interferes with activities of daily living, pain with passive range of motion, crepitus and joint swelling.  Patient has evidence of subchondral cysts, subchondral sclerosis and joint space narrowing by imaging studies. There is no active infection.  There are no active problems to display for this patient.  Past Medical History  Diagnosis Date  . Hypertension   . Diabetes mellitus   . GERD (gastroesophageal reflux disease)   . Obesity     Past Surgical History  Procedure Laterality Date  . Cholecystectomy       (Not in a hospital admission) Allergies  Allergen Reactions  . Sulfa Antibiotics Hives    History  Substance Use Topics  . Smoking status: Former Smoker    Quit date: 12/27/2009  . Smokeless tobacco: Not on file  . Alcohol Use: No    No family history on file.   Review of Systems  Constitutional: Negative.   HENT: Negative.   Eyes: Negative.   Respiratory: Negative.   Cardiovascular: Negative.   Gastrointestinal: Negative.   Genitourinary: Negative.   Musculoskeletal: Positive for joint pain.  Skin: Negative.   Neurological: Negative.   Endo/Heme/Allergies: Negative.   Psychiatric/Behavioral: Negative.      Objective:  Physical Exam  Constitutional: She is oriented to person, place, and time. She appears well-developed and well-nourished.  HENT:  Head: Normocephalic and atraumatic.  Eyes: EOM are normal. Pupils are equal, round, and reactive to light.  Neck: Normal range of motion. Neck supple.  Cardiovascular: Normal rate and regular rhythm.  Exam reveals no gallop and no friction rub.   No murmur heard. Respiratory: Effort normal. No respiratory distress. She has no wheezes. She has no rales.  GI: Soft. Bowel sounds are normal. She exhibits no distension. There is no tenderness.  Neurological: She is alert and oriented to person, place, and time.  Skin: Skin is warm and dry.  Psychiatric: She has a normal mood and affect. Her behavior is normal. Judgment and thought content normal.    Vital signs in last 24 hours: @VSRANGES @  Labs:   Estimated body mass index is 36.61 kg/(m^2) as calculated from the following:   Height as of 06/04/14: 5\' 5"  (1.651 m).   Weight as of 06/04/14: 99.791 kg (220 lb).   Imaging Review Plain radiographs demonstrate severe degenerative joint disease of the right knee(s). The overall alignment ismild varus. The bone quality appears to be fair for age and reported activity level.  Assessment/Plan:  End stage arthritis, right knee   The patient history, physical examination, clinical judgment of the Robin [name] and imaging studies are consistent with end stage degenerative joint disease of the right knee(s) and total knee arthroplasty is deemed medically necessary. The treatment options including medical management, injection therapy arthroscopy and arthroplasty were discussed at length. The  risks and benefits of total knee arthroplasty were presented and reviewed. The risks due to aseptic loosening, infection, stiffness, patella tracking problems, thromboembolic complications and other imponderables were discussed. The patient acknowledged the explanation,  agreed to proceed with the plan and consent was signed. Patient is being admitted for inpatient treatment for surgery, pain control, PT, OT, prophylactic antibiotics, VTE prophylaxis, progressive ambulation and ADL's and discharge planning. The patient is planning to be discharged home with home health services   

## 2014-07-03 NOTE — Discharge Summary (Addendum)
Patient ID: Robin [name] MRN: [id number] DOB/AGE: [birth year] 48 y.o.  Admit date: 07/03/2014 Discharge date: 07/05/2014  Admission Diagnoses:  Active Problems:   DJD (degenerative joint disease) of knee   Discharge Diagnoses:  Same ABLA: Hgb 12.4 pre-op and 10.8 post-op.  Past Medical History  Diagnosis Date  . Hypertension   . Diabetes mellitus   . GERD (gastroesophageal reflux disease)   . Obesity   . Headache(784.0)     hx migraine  . Arthritis     Surgeries: Procedure(s): TOTAL KNEE ARTHROPLASTY on 07/03/2014   Consultants:    Discharged Condition: Improved  Hospital Course: Robin [name] is an 48 y.o. female who was admitted 07/03/2014 for operative treatment of<principal problem not specified>. Patient has severe unremitting pain that affects sleep, daily activities, and work/hobbies. After pre-op clearance the patient was taken to the operating room on 07/03/2014 and underwent  Procedure(s): TOTAL KNEE ARTHROPLASTY.    Patient was given perioperative antibiotics:     Anti-infectives   Start     Dose/Rate Route Frequency Ordered Stop   07/03/14 1600  ceFAZolin (ANCEF) IVPB 2 g/50 mL premix     2 g 100 mL/hr over 30 Minutes Intravenous Every 6 hours 07/03/14 1509 07/03/14 2246   07/03/14 0600  ceFAZolin (ANCEF) IVPB 2 g/50 mL premix     2 g 100 mL/hr over 30 Minutes Intravenous On call to O.R. 07/02/14 1408 07/03/14 0845       Patient was given sequential compression devices, early ambulation, and chemoprophylaxis to prevent DVT.  Patient benefited maximally from hospital stay and there were no complications.    Recent vital signs:  Patient Vitals for the past 24 hrs:  BP Temp Temp src Pulse Resp SpO2 Height Weight  07/05/14 0500 132/88 mmHg 98.8 F (37.1 C) Oral 110 16 95 % - -  07/04/14 2032 123/83 mmHg 100.1 F (37.8 C) Oral 128 16 94 % - -  07/04/14 1500 - - - - - - 5\' 5"  (1.651 m) 102.059 kg (225 lb)  07/04/14 1317 132/100 mmHg 98.4 F (36.9 C) Oral  122 - 91 % - -     Recent laboratory studies:   Recent Labs  07/04/14 0457  WBC 16.3*  HGB 10.8*  HCT 35.5*  PLT 276  NA 134*  K 5.1  CL 95*  CO2 26  BUN 9  CREATININE 0.70  GLUCOSE 188*  CALCIUM 8.5     Discharge Medications:     Medication List         albuterol 108 (90 BASE) MCG/ACT inhaler  Commonly known as:  PROVENTIL HFA;VENTOLIN HFA  Inhale 2 puffs into the lungs every 6 (six) hours as needed. For shortness of breath     aspirin EC 325 MG tablet  Take 1 tablet (325 mg total) by mouth daily.     aspirin-acetaminophen-caffeine 250-250-65 MG per tablet  Commonly known as:  EXCEDRIN MIGRAINE  Take 2 tablets by mouth every 6 (six) hours as needed for headache.     bisacodyl 5 MG EC tablet  Commonly known as:  DULCOLAX  Take 1 tablet (5 mg total) by mouth daily as needed for moderate constipation.     insulin glargine 100 UNIT/ML injection  Commonly known as:  LANTUS  Inject 50 Units into the skin 2 (two) times daily as needed (low blood sugar).     INVOKAMET 50-1000 MG Tabs  Generic drug:  Canagliflozin-Metformin HCl  Take 1 tablet by mouth 2 (two)  times daily before a meal.     lisinopril-hydrochlorothiazide 20-12.5 MG per tablet  Commonly known as:  PRINZIDE,ZESTORETIC  Take 1 tablet by mouth daily.     methocarbamol 500 MG tablet  Commonly known as:  ROBAXIN  Take 1 tablet (500 mg total) by mouth 4 (four) times daily.     omeprazole 10 MG capsule  Commonly known as:  PRILOSEC  Take 10 mg by mouth daily.     ondansetron 4 MG tablet  Commonly known as:  ZOFRAN  Take 1 tablet (4 mg total) by mouth every 8 (eight) hours as needed for nausea or vomiting.     oxyCODONE-acetaminophen 5-325 MG per tablet  Commonly known as:  ROXICET  Take 1-2 tablets by mouth every 4 (four) hours as needed.        Diagnostic Studies: Dg Chest 2 View  06/20/2014   CLINICAL DATA:  48 year old female preoperative study for knee surgery. Hypertension diabetes.  Initial encounter.  EXAM: CHEST  2 VIEW  COMPARISON:  Portable chest radiograph 12/24/2005.  FINDINGS: Normal lung volumes. Normal cardiac size and mediastinal contours. Visualized tracheal air column is within normal limits. The lungs remain clear. No pneumothorax or effusion. No acute osseous abnormality identified.  IMPRESSION: Negative, no acute cardiopulmonary abnormality.   Electronically Signed   By: Augusto GambleLee  Hall M.D.   On: 06/20/2014 16:23   Dg Knee 1-2 Views Left  06/04/2014   CLINICAL DATA:  Knee pain for 1 month  EXAM: LEFT KNEE - 1-2 VIEW  COMPARISON:  12/08/2012  FINDINGS: Stable degenerative changes are noted in all 3 joint compartments. No acute soft tissue abnormality is seen. No acute fracture is noted.  IMPRESSION: Degenerative change without acute abnormality.   Electronically Signed   By: Alcide CleverMark  Lukens M.D.   On: 06/04/2014 19:41   Dg Knee 1-2 Views Right  06/04/2014   CLINICAL DATA:  Posterior right knee pain  EXAM: RIGHT KNEE - 1-2 VIEW  COMPARISON:  None.  FINDINGS: Moderately severe right knee tricompartmental osteoarthritis with joint space loss, sclerosis and osteophyte formation. No malalignment, fracture or effusion. No definite soft tissue abnormality.  IMPRESSION: Right knee tricompartmental osteoarthritis.  No acute finding.   Electronically Signed   By: Ruel Favorsrevor  Shick M.D.   On: 06/04/2014 19:39   Dg Knee Right Port  07/03/2014   CLINICAL DATA:  Status post right knee arthroplasty.  EXAM: PORTABLE RIGHT KNEE - 1-2 VIEW  COMPARISON:  06/04/2014  FINDINGS: Alignment of a total knee arthroplasty is normal. Surgical drain is present in the suprapatellar region. No fracture identified.  IMPRESSION: Normal alignment and radiographic appearance of total knee arthroplasty.   Electronically Signed   By: Irish LackGlenn  Yamagata M.D.   On: 07/03/2014 10:53    Disposition: 01-Home or Self Care ABLA: Hgb 12.4 pre-op and 10.8 post-op.   Discharge Instructions   CPM    Complete by:  As directed    Continuous passive motion machine (CPM):      Use the CPM from 0- to 60 for 6 hours per day.      You may increase by 10 per day.  You may break it up into 2 or 3 sessions per day.      Use CPM for 2 weeks or until you are told to stop.     Call MD / Call 911    Complete by:  As directed   If you experience chest pain or shortness of breath, CALL 911 and be  transported to the hospital emergency room.  If you develope a fever above 101 F, pus (white drainage) or increased drainage or redness at the wound, or calf pain, call your surgeon's office.     Change dressing    Complete by:  As directed   Change dressing on Saturday, then change the dressing daily with sterile 4 x 4 inch gauze dressing and apply TED hose.  You may clean the incision with alcohol prior to redressing.     Constipation Prevention    Complete by:  As directed   Drink plenty of fluids.  Prune juice may be helpful.  You may use a stool softener, such as Colace (over the counter) 100 mg twice a day.  Use MiraLax (over the counter) for constipation as needed.     Diet - low sodium heart healthy    Complete by:  As directed      Discharge instructions    Complete by:  As directed   Weight bearing as tolerated.  Take Aspirin 1 tab a day for the next 30 days to prevent blood clots.  Change dressing daily starting on Saturday.  May shower on Monday, but do not soak incisions.  May apply ice for up to 20 minutes at a time for pain and swelling.  Follow up appointment in two weeks.     Do not put a pillow under the knee. Place it under the heel.    Complete by:  As directed   Place gray foam under operative heel when in bed or in a chair to work on extension     Increase activity slowly as tolerated    Complete by:  As directed      TED hose    Complete by:  As directed   Use stockings (TED hose) for 2 weeks on both leg(s).  You may remove them at night for sleeping.           Follow-up Information   Follow up with  [facility], MD. Schedule an appointment as soon as possible for a visit in 2 weeks.   Specialty:  Orthopedic Surgery   Contact information:   [address] [place] [address] [phone]       Follow up with [facility]. (Someone from [facility] will contact you concerning start date and time for physical therapy.)    Contact information:   [address] [place] [address] [phone]        Signed: Gearldine Shown 07/05/2014, 7:35 AM

## 2014-07-03 NOTE — Anesthesia Procedure Notes (Addendum)
**Note De-[name]** Procedure Name: Intubation Date/Time: 07/03/2014 8:47 AM Performed by: Leonel Ramsay'LAUGHLIN, Robin [name], Robin [name], Robin [name] Intubation Type: IV induction Laryngoscope Size: Miller and 4 Grade View: Grade I Tube type: Oral Tube size: 7.5 mm Number of attempts: 1 Airway Equipment and Method: Stylet Placement Confirmation: ETT inserted through vocal cords under direct vision,  positive ETCO2 and breath sounds checked- equal and bilateral Secured at: 21 cm Tube secured with: Tape Dental Injury: Teeth and Oropharynx as per pre-operative assessment

## 2014-07-03 NOTE — Anesthesia Preprocedure Evaluation (Addendum)
Anesthesia Evaluation  Patient identified by MRN, date of birth, ID band Patient awake    Reviewed: Allergy & Precautions, H&P , NPO status , Patient's Chart, lab work & pertinent test results, reviewed documented beta blocker date and time   Airway Mallampati: II TM Distance: >3 FB Neck ROM: full    Dental   Pulmonary asthma , former smoker,  breath sounds clear to auscultation        Cardiovascular Exercise Tolerance: Good hypertension, On Medications Rhythm:regular  Denies CV symptoms   Neuro/Psych  Headaches, negative psych ROS   GI/Hepatic Neg liver ROS, GERD-  Medicated and Controlled,  Endo/Other  diabetes, Insulin Dependent, Oral Hypoglycemic Agents  Renal/GU negative Renal ROS  negative genitourinary   Musculoskeletal   Abdominal   Peds  Hematology negative hematology ROS (+)   Anesthesia Other Findings See surgeon's H&P   Reproductive/Obstetrics negative OB ROS                         Anesthesia Physical Anesthesia Plan  ASA: III  Anesthesia Plan: General   Post-op Pain Management:    Induction: Intravenous  Airway Management Planned: Oral ETT  Additional Equipment:   Intra-op Plan:   Post-operative Plan: Extubation in OR  Informed Consent: I have reviewed the patients History and Physical, chart, labs and discussed the procedure including the risks, benefits and alternatives for the proposed anesthesia with the patient or authorized representative who has indicated his/her understanding and acceptance.   Dental Advisory Given  Plan Discussed with: CRNA and Surgeon  Anesthesia Plan Comments:        Anesthesia Quick Evaluation

## 2014-07-03 NOTE — Transfer of Care (Signed)
Immediate Anesthesia Transfer of Care Note  Patient: Robin [name]  Procedure(s) Performed: Procedure(s): TOTAL KNEE ARTHROPLASTY (Right)  Patient Location: PACU  Anesthesia Type:General  Level of Consciousness: awake, alert  and oriented  Airway & Oxygen Therapy: Patient Spontanous Breathing and Patient connected to nasal cannula oxygen  Post-op Assessment: Report given to PACU RN and Post -op Vital signs reviewed and stable  Post vital signs: Reviewed and stable  Complications: No apparent anesthesia complications

## 2014-07-03 NOTE — Discharge Instructions (Signed)
Total Knee Replacement Care After Refer to this sheet in the next few weeks. These instructions provide you with information on caring for yourself after your procedure. Your caregiver also may give you specific instructions. Your treatment has been planned according to the most current medical practices, but problems sometimes occur. Call your caregiver if you have any problems or questions after your procedure. HOME CARE INSTRUCTIONS   Weight bearing as tolerated.  Take Aspirin 1 tab a day for the next 30 days to prevent blood clots.  Change dressing daily starting on Saturday.  May take a shower on Monday, but do not soak incision.  May apply ice for up to 20 minutes at a time for pain and swelling.  Follow up appointment in two weeks.    See a physical therapist as directed by your caregiver.  Take over-the-counter or prescription medicines for pain, discomfort, or fever only as directed by your caregiver.  Avoid lifting or driving until you are instructed otherwise.  If you have been sent home with a continuous passive motion machine, use it as directed by your caregiver. SEEK MEDICAL CARE IF:  You have difficulty breathing.  Your wound is red, swollen, or has become increasingly painful.  You have pus draining from your wound.  You have a bad smell coming from your wound.  You have persistent bleeding from your wound.  Your wound breaks open after sutures (stitches) or staples have been removed. SEEK IMMEDIATE MEDICAL CARE IF:   You have a fever.  You have a rash.  You have pain or swelling in your calf or thigh.  You have shortness of breath or chest pain.  Your range of motion in your knee is decreasing rather than increasing. MAKE SURE YOU:   Understand these instructions.  Will watch your condition.  Will get help right away if you are not doing well or get worse. Document Released: 07/02/2005 Document Revised: 06/13/2012 Document Reviewed:  02/01/2012 [facility] Patient Information 2015 [place], [place]. This information is not intended to replace advice given to you by your health care provider. Make sure you discuss any questions you have with your health care provider.

## 2014-07-03 NOTE — Anesthesia Postprocedure Evaluation (Signed)
Anesthesia Post Note  Patient: Robin [name]  Procedure(s) Performed: Procedure(s) (LRB): TOTAL KNEE ARTHROPLASTY (Right)  Anesthesia type: General  Patient location: PACU  Post pain: Pain level controlled  Post assessment: Patient's Cardiovascular Status Stable  Last Vitals:  Filed Vitals:   07/03/14 1130  BP: 150/78  Pulse: 77  Temp:   Resp: 15    Post vital signs: Reviewed and stable  Level of consciousness: alert  Complications: No apparent anesthesia complications

## 2014-07-03 NOTE — Progress Notes (Signed)
Utilization review completed.  

## 2014-07-04 LAB — GLUCOSE, CAPILLARY
GLUCOSE-CAPILLARY: 195 mg/dL — AB (ref 70–99)
Glucose-Capillary: 166 mg/dL — ABNORMAL HIGH (ref 70–99)
Glucose-Capillary: 152 mg/dL — ABNORMAL HIGH (ref 70–99)
Glucose-Capillary: 127 mg/dL — ABNORMAL HIGH (ref 70–99)

## 2014-07-04 LAB — BASIC METABOLIC PANEL
Anion gap: 13 (ref 5–15)
BUN: 9 mg/dL (ref 6–23)
CHLORIDE: 95 meq/L — AB (ref 96–112)
CO2: 26 meq/L (ref 19–32)
Calcium: 8.5 mg/dL (ref 8.4–10.5)
Creatinine, Ser: 0.7 mg/dL (ref 0.50–1.10)
GFR calc Af Amer: >90 mL/min (ref >90)
GFR calc non Af Amer: >90 mL/min (ref >90)
GLUCOSE: 188 mg/dL — AB (ref 70–99)
POTASSIUM: 5.1 meq/L (ref 3.7–5.3)
SODIUM: 134 meq/L — AB (ref 137–147)

## 2014-07-04 LAB — CBC
HCT: 35.5 % — ABNORMAL LOW (ref 36.0–46.0)
HEMOGLOBIN: 10.8 g/dL — AB (ref 12.0–15.0)
MCH: 26 pg (ref 26.0–34.0)
MCHC: 30.4 g/dL (ref 30.0–36.0)
MCV: 85.5 fL (ref 78.0–100.0)
Platelets: 276 10*3/uL (ref 150–400)
RBC: 4.15 MIL/uL (ref 3.87–5.11)
RDW: 16.4 % — ABNORMAL HIGH (ref 11.5–15.5)
WBC: 16.3 10*3/uL — AB (ref 4.0–10.5)

## 2014-07-04 NOTE — Evaluation (Signed)
Physical Therapy Evaluation Patient Details Name: Robin [name] MRN: [id number] DOB: 19-Dec-1966 Today's Date: 07/04/2014   History of Present Illness  48 y.o. female s/p right total knee arthroplasty. Hx of HTN, DM, GERD, and obesity.  Clinical Impression  Pt is s/p right TKA resulting in the deficits listed below (see PT Problem List). Ambulates up to 20 feet with min guard this AM, limited by nausea. Practiced transfers from various surfaces. Plan for stair training this afternoon. Pt will benefit from skilled PT to increase their independence and safety with mobility to allow discharge to the venue listed below.      Follow Up Recommendations Home health PT;Supervision for mobility/OOB    Equipment Recommendations  None recommended by PT    Recommendations for Other Services OT consult     Precautions / Restrictions Precautions Precautions: Knee Precaution Comments: Reviewed knee precautions and use of footsie roll. Required Braces or Orthoses: Knee Immobilizer - Right Knee Immobilizer - Right: On except when in CPM ("except cpm/PT") Restrictions Weight Bearing Restrictions: Yes RLE Weight Bearing: Weight bearing as tolerated      Mobility  Bed Mobility Overal bed mobility: Needs Assistance Bed Mobility: Supine to Sit     Supine to sit: Supervision     General bed mobility comments: Supervision for safety with HOB elevated and VCs for technique. Heavy use of rail and requires extra time.  Transfers Overall transfer level: Needs assistance Equipment used: Rolling walker (2 wheeled) Transfers: Sit to/from Stand Sit to Stand: Min guard         General transfer comment: Min guard for safety performed from lowest bed setting and BSC. VCs for hand placement.   Ambulation/Gait Ambulation/Gait assistance: Min guard Ambulation Distance (Feet): 20 Feet Assistive device: Rolling walker (2 wheeled) Gait Pattern/deviations: Step-to pattern;Decreased step length -  left;Decreased stance time - right;Antalgic   Gait velocity interpretation: Below normal speed for age/gender General Gait Details: Educated on safe use of DME with VCs for walker placement and to extend right knee in stance phase for quad activation. No instances of buckling with knee immobilizer in place. Pt required to sit due to nausea.  Stairs            Wheelchair Mobility    Modified Rankin (Stroke Patients Only)       Balance Overall balance assessment: Needs assistance Sitting-balance support: No upper extremity supported;Feet supported Sitting balance-Leahy Scale: Good     Standing balance support: No upper extremity supported Standing balance-Leahy Scale: Fair                               Pertinent Vitals/Pain 10/10 pain Pt denies having nurse notified for pain medication as she reports the medicine makes her feel "funny" Patient repositioned in chair for comfort.     Home Living Family/patient expects to be discharged to:: Private residence Living Arrangements: Children Available Help at Discharge: Family;Available 24 hours/day Type of Home: Apartment Home Access: Stairs to enter Entrance Stairs-Rails: Right;Left Entrance Stairs-Number of Steps: 16 Home Layout: One level Home Equipment: Walker - 2 wheels;Bedside commode      Prior Function Level of Independence: Independent with assistive device(s)         Comments: occasionally used crutches for ambulation     Hand Dominance   Dominant Hand: Right    Extremity/Trunk Assessment   Upper Extremity Assessment: Defer to OT evaluation  Lower Extremity Assessment: RLE deficits/detail RLE Deficits / Details: decreased strength and ROM as expected post op.        Communication   Communication: No difficulties  Cognition Arousal/Alertness: Awake/alert Behavior During Therapy: WFL for tasks assessed/performed Overall Cognitive Status: Within Functional Limits for  tasks assessed                      General Comments General comments (skin integrity, edema, etc.): Pt with nausea (-) for emesis.     Exercises Total Joint Exercises Ankle Circles/Pumps: AROM;Both;10 reps;Supine Quad Sets: AROM;Right;10 reps;Supine      Assessment/Plan    PT Assessment Patient needs continued PT services  PT Diagnosis Difficulty walking;Abnormality of gait;Acute pain   PT Problem List Decreased strength;Decreased range of motion;Decreased activity tolerance;Decreased balance;Decreased mobility;Decreased knowledge of use of DME;Decreased knowledge of precautions;Pain  PT Treatment Interventions DME instruction;Gait training;Stair training;Functional mobility training;Therapeutic activities;Therapeutic exercise;Balance training;Neuromuscular re-education;Patient/family education;Modalities   PT Goals (Current goals can be found in the Care Plan section) Acute Rehab PT Goals Patient Stated Goal: Go home today PT Goal Formulation: With patient Time For Goal Achievement: 07/11/14 Potential to Achieve Goals: Good    Frequency 7X/week   Barriers to discharge        Co-evaluation               End of Session Equipment Utilized During Treatment: Right knee immobilizer Activity Tolerance: Other (comment) (Limited by nausea) Patient left: in chair;with call bell/phone within reach;with family/visitor present Nurse Communication: Mobility status;Other (comment) (Nausea)         Time: 5409-81190847-0921 PT Time Calculation (min): 34 min   Charges:   PT Evaluation $Initial PT Evaluation Tier I: 1 Procedure PT Treatments $Gait Training: 8-22 mins $Therapeutic Activity: 8-22 mins   PT G Codes:        Charlsie MerlesLogan Secor Verdon Ferrante, [place]PT [phone]   Berton MountBarbour, Kentaro Alewine S 07/04/2014, 10:19 AM

## 2014-07-04 NOTE — Progress Notes (Signed)
Subjective: 1 Day Post-Op Procedure(s) (LRB): TOTAL KNEE ARTHROPLASTY (Right) Patient reports pain as 8 on 0-10 scale.  Patient admits to nausea/vomiting.  No lightheadedness/dizziness.  Positive flatus but no bm as of yet.    Objective: Vital signs in last 24 hours: Temp:  [98 F (36.7 C)-98.5 F (36.9 C)] 98.5 F (36.9 C) (07/09 0512) Pulse Rate:  [72-107] 105 (07/09 0512) Resp:  [14-19] 16 (07/09 0512) BP: (127-172)/(69-103) 152/92 mmHg (07/09 0512) SpO2:  [94 %-100 %] 94 % (07/09 0512)  Intake/Output from previous day: 07/08 0701 - 07/09 0700 In: 1280 [P.O.:480; I.V.:800] Out: 480 [Drains:480] Intake/Output this shift: Total I/O In: 1445 [P.O.:240; I.V.:1205] Out: -    Recent Labs  07/04/14 0457  HGB 10.8*    Recent Labs  07/04/14 0457  WBC 16.3*  RBC 4.15  HCT 35.5*  PLT 276    Recent Labs  07/04/14 0457  NA 134*  K 5.1  CL 95*  CO2 26  BUN 9  CREATININE 0.70  GLUCOSE 188*  CALCIUM 8.5   No results found for this basename: LABPT, INR,  in the last 72 hours  Neurologically intact Neurovascular intact Sensation intact distally Intact pulses distally Dorsiflexion/Plantar flexion intact Compartment soft WBAT RLE hemovac drain pulled by me today No drainage through dressing Negative homen's bilaterally  Assessment/Plan: 1 Day Post-Op Procedure(s) (LRB): TOTAL KNEE ARTHROPLASTY (Right) Advance diet Up with therapy D/C IV fluids Discharge home with home health hopefully today ABLA: will continue to follow  ANTON, M. LINDSEY 07/04/2014, 7:17 AM

## 2014-07-04 NOTE — Plan of Care (Signed)
Problem: Consults Goal: Diagnosis- Total Joint Replacement Primary Total Knee Right     

## 2014-07-04 NOTE — Op Note (Signed)
NAME:  Robin [name], Temia               ACCOUNT NO.:  [id number]  MEDICAL RECORD NO.:  [id number]  LOCATION:  5N10C                        FACILITY:  [facility]  PHYSICIAN:  Loreta Aveaniel F. Faheem Ziemann, M.D. DATE OF BIRTH:  [birth year]  DATE OF PROCEDURE:  07/03/2014 DATE OF DISCHARGE:                              OPERATIVE REPORT   PREOPERATIVE DIAGNOSIS:  Right knee end-stage degenerative arthritis, varus alignment.  POSTOPERATIVE DIAGNOSIS:  Right knee end-stage degenerative arthritis, varus alignment.  PROCEDURE:  Modified minimally invasive right total knee replacement with Stryker triathlon prosthesis.  Soft tissue balancing.  Cemented pegged cruciate retaining #3 femoral component.  Cemented #4 tibial component, 9 mm CS polyethylene insert.  Cemented resurfacing 35-mm patellar component.  SURGEON:  Loreta Aveaniel F. Sya Nestler, M.D.  ASSISTANT:  Odelia GageLindsey Anton PA, present throughout the entire case and necessary for timely completion of procedure.  ANESTHESIA:  General.  BLOOD LOSS:  Minimal.  SPECIMENS:  None.  CULTURES:  None.  COMPLICATIONS:  None.  DRESSINGS:  Soft compressive knee immobilizer.  TOURNIQUET TIME:  45 minutes.  DRAINS:  Hemovac x1.  DESCRIPTION OF PROCEDURE:  The patient was brought to operating room, placed on the operating table in supine position.  After adequate anesthesia had been obtained, tourniquet applied.  Prepped and draped in usual sterile fashion.  Exsanguinated with elevation of Esmarch. Tourniquet inflated to 350 mmHg.  Straight incision above the patella down to tibial tubercle.  Skin and subcutaneous tissue divided.  Medial arthrotomy, vastus splitting, preserving quad tendon.  Medial capsule release.  Knee exposed.  A 8 mm resection distal femur 5 degrees of valgus, flexible intramedullary guide.  Using epicondylar axis, the femur was sized, cut, and fitted for a pegged #3 cruciate retaining component.  Proximal tibial resection with extramedullary  guide.  Size #4 component.  9 mm CS polyethylene insert.  Patella exposed, posterior 10 mm removed, drilled, sized, and fitted for a 35 mm component.  With trials in place, tibia was marked for rotation and hand reamed.  All trials removed.  Copious irrigation with a pulse irrigating device. Cement prepared, placed on all components, firmly seated.  Polyethylene attached to tibia, knee reduced.  Patella held with a clamp.  Once cement hardened, the knee was irrigated again.  Soft tissues injected with Exparel.  Examined with full motion good stability, good patellar tracking.  Hemovac was placed through a separate stab wound.  Arthrotomy closed with #1 Vicryl.  Skin and subcutaneous tissue with subcutaneous subcuticular closure.  Sterile compressive dressing applied.  Tourniquet deflated and  removed.  Knee immobilizer applied.  Anesthesia reversed. Brought to the recovery room.  Tolerated the surgery well with no complications.     Loreta Aveaniel F. Daissy Yerian, M.D.     DFM/MEDQ  D:  07/03/2014  T:  07/04/2014  Job:  [id number]

## 2014-07-04 NOTE — Evaluation (Signed)
Occupational Therapy Evaluation Patient Details Name: Robin [name] MRN: [id number] DOB: 10-16-1966 Today's Date: 07/04/2014    History of Present Illness 48 y.o. female s/p right total knee arthroplasty. Hx of HTN, DM, GERD, and obesity.   Clinical Impression   Pta pt was independent with ADLs and now from above presents with generalized weakness, pain and limited ROM interfering with her independence with self care activities. Pt will have 24/7 supervision at home from family and would benefit from additional acute OT to practice a tub transfer to the 3in1 and review LB dressing. Will continue to follow.    Follow Up Recommendations  Supervision/Assistance - 24 hour          Precautions / Restrictions Precautions Precautions: Knee Precaution Comments: Reviewed knee precautions and use of footsie roll and KI. Required Braces or Orthoses: Knee Immobilizer - Right Knee Immobilizer - Right: On except when in CPM Restrictions Weight Bearing Restrictions: Yes RLE Weight Bearing: Weight bearing as tolerated      Mobility Bed Mobility Overal bed mobility: Needs Assistance Bed Mobility: Sit to Supine     Sit to supine: Min assist   General bed mobility comments: min A for RLE, but pt able to position herself in bed using the rails  Transfers Overall transfer level: Needs assistance Equipment used: Rolling walker (2 wheeled) Transfers: Sit to/from Stand Sit to Stand: Min guard         General transfer comment: Min guard for safety performed to the lowest bed setting and from [facility]. VCs for hand placement and to prop RLE out on descent.     Balance Overall balance assessment: Needs assistance Sitting-balance support: No upper extremity supported;Feet supported Sitting balance-Leahy Scale: Good     Standing balance support: No upper extremity supported Standing balance-Leahy Scale: Fair                              ADL Overall ADL's : Needs  assistance/impaired Eating/Feeding: Independent;Sitting   Grooming: Set up;Sitting   Upper Body Bathing: Set up;Sitting   Lower Body Bathing: Sit to/from stand;Moderate assistance   Upper Body Dressing : Set up;Sitting   Lower Body Dressing: Moderate assistance;Sit to/from stand   Toilet Transfer: Min guard;Ambulation;RW;BSC   Toileting- Architect and Hygiene: Min guard;Sit to/from stand       Functional mobility during ADLs: Rolling walker;Min guard General ADL Comments: Pt was using the bathroom upon entering room. Pt was extremely hot and needed the fan on her while using the bathroom. Pt needs A for getting socks and shoes on as well as getting her underwear and pants on, but she would be able to stand and pull them up. Pt states that she will have family supervision at home and would like to practice a tub transfer prior to d/c.               Pertinent Vitals/Pain Pt c/o pain but did not rate. Nurse notified and pt was given pain medicine during tx.     Hand Dominance Right   Extremity/Trunk Assessment Upper Extremity Assessment Upper Extremity Assessment: Overall WFL for tasks assessed         Communication Communication Communication: No difficulties   Cognition Arousal/Alertness: Awake/alert Behavior During Therapy: WFL for tasks assessed/performed Overall Cognitive Status: Within Functional Limits for tasks assessed  Home Living Family/patient expects to be discharged to:: Private residence Living Arrangements: Children Available Help at Discharge: Family;Available 24 hours/day Type of Home: Apartment Home Access: Stairs to enter Entrance Stairs-Number of Steps: 16 Entrance Stairs-Rails: Right;Left Home Layout: One level     Bathroom Shower/Tub: Tub/shower unit Shower/tub characteristics: Curtain FirefighterBathroom Toilet: Standard     Home Equipment: Environmental consultantWalker - 2 wheels;Bedside commode           Prior Functioning/Environment Level of Independence: Independent with assistive device(s)        Comments: occasionally used crutches for ambulation    OT Diagnosis: Generalized weakness;Acute pain   OT Problem List: Decreased strength;Decreased range of motion;Decreased knowledge of use of DME or AE;Impaired balance (sitting and/or standing);Pain   OT Treatment/Interventions: Self-care/ADL training;Energy conservation;DME and/or AE instruction;Patient/family education;Balance training    OT Goals(Current goals can be found in the care plan section) Acute Rehab OT Goals Patient Stated Goal: to go home soon OT Goal Formulation: With patient Time For Goal Achievement: 07/11/14 Potential to Achieve Goals: Good ADL Goals Pt Will Perform Lower Body Bathing: with min assist;sit to/from stand Pt Will Perform Lower Body Dressing: with min assist;sit to/from stand Pt Will Perform Tub/Shower Transfer: with min guard assist;3 in 1;rolling walker;Tub transfer  OT Frequency: Min 2X/week    End of Session Equipment Utilized During Treatment: Rolling walker;Right knee immobilizer CPM Right Knee  Additional Comments: Pt stated she would call her nurse when she was ready to get the CPM back on.  Activity Tolerance: Patient limited by fatigue Patient left: in bed;with call bell/phone within reach;with family/visitor present   Time:  -    Charges:    G-CodesMaurene [name]:    Robin [name] 07/04/2014, 10:58 AM

## 2014-07-04 NOTE — Care Management Note (Addendum)
CARE MANAGEMENT NOTE 07/04/2014  Patient:  Quincy SheehanJOHNSON,Ylianna L   Account Number:  [id number]  Date Initiated:  07/04/2014  Documentation initiated by:  Vance PeperBRADY,Sharron Petruska  Subjective/Objective Assessment:   48 yr old female s/p right total knee arthroplasty.     Action/Plan:   Case manager spoke with patient concerning home health and DME needs at discharge.  [facility], no changes..Patient has family support at discharge.   Anticipated DC Date:  07/04/2014   Anticipated DC Plan:  HOME W HOME HEALTH SERVICES      DC Planning Services  CM consult      [facility]  DURABLE MEDICAL EQUIPMENT   Choice offered to / List presented to:  C-1 Patient   DME arranged  3-N-1  WALKER - ROLLING  CPM      DME agency  [employer]     HH arranged  HH-2 PT      [facility]   Status of service:  Completed, signed off Medicare Important Message given?   (If response is "NO", the following Medicare IM given date fields will be blank) Date Medicare IM given:   Medicare IM given by:   Date Additional Medicare IM given:   Additional Medicare IM given by:    Discharge Disposition:  HOME W HOME HEALTH SERVICES  Per UR Regulation:  Reviewed for med. necessity/level of care/duration of stay

## 2014-07-04 NOTE — Evaluation (Signed)
I have read and agree with this note.   Time in/out: 1003-1029  Total time: 26 minutes (Ev and 1SC)  Ignacia Palmaathy Tamie Minteer, OTR/L [phone]

## 2014-07-04 NOTE — Progress Notes (Signed)
Physical Therapy Treatment Patient Details Name: Robin [name] MRN: [id number] DOB: 1966/06/26 Today's Date: 07/04/2014    History of Present Illness 48 y.o. female s/p right total knee arthroplasty. Hx of HTN, DM, GERD, and obesity.    PT Comments    Patient is progressing well towards physical therapy goals, ambulating up to 55 feet this afternoon however, the requested stair training be held until tomorrow due to pain and fatigue. Will follow up in AM for stair training as she has a full flight of steps to climb in order to enter her apartment. Patient will continue to benefit from skilled physical therapy services to further improve independence with functional mobility.    Follow Up Recommendations  Home health PT;Supervision for mobility/OOB     Equipment Recommendations  None recommended by PT    Recommendations for Other Services OT consult     Precautions / Restrictions Precautions Precautions: Knee Precaution Comments: Reviewed knee precautions and use of footsie roll. Required Braces or Orthoses: Knee Immobilizer - Right Knee Immobilizer - Right: On except when in CPM ("except cpm/PT") Restrictions Weight Bearing Restrictions: Yes RLE Weight Bearing: Weight bearing as tolerated    Mobility  Bed Mobility Overal bed mobility: Needs Assistance Bed Mobility: Supine to Sit;Sit to Supine     Supine to sit: Min guard Sit to supine: Min guard   General bed mobility comments: Min guard for safety with HOB flat and VCs for technique. Educated to use LLE to support RLE.  Transfers Overall transfer level: Needs assistance Equipment used: Rolling walker (2 wheeled) Transfers: Sit to/from Stand Sit to Stand: Min guard;From elevated surface         General transfer comment: Min guard for safety performed from elevated bed setting similar to home environment and BSC. VCs for hand placement.   Ambulation/Gait Ambulation/Gait assistance: Supervision Ambulation  Distance (Feet): 55 Feet Assistive device: Rolling walker (2 wheeled) Gait Pattern/deviations: Step-to pattern;Step-through pattern;Decreased step length - left;Decreased stance time - right;Antalgic   Gait velocity interpretation: Below normal speed for age/gender General Gait Details: Educated on safe use of DME with VCs for forward gaze. Takes several standing rest breaks due to reported fatigue and pain. VCs to extend right knee in stance phase for quad activation. No instances of buckling with knee immobilizer in place   Stairs            Wheelchair Mobility    Modified Rankin (Stroke Patients Only)       Balance                                    Cognition Arousal/Alertness: Awake/alert Behavior During Therapy: WFL for tasks assessed/performed Overall Cognitive Status: Within Functional Limits for tasks assessed                      Exercises Total Joint Exercises Ankle Circles/Pumps: AROM;Both;10 reps;Supine Quad Sets: AROM;Right;10 reps;Supine    General Comments General comments (skin integrity, edema, etc.): Pt reports increased pain since previous visit. States she would like to practice steps tomorrow as she is in too today.      Pertinent Vitals/Pain Pt reports pain as moderate States she has just recently received pain medication prior to therapy session Patient repositioned in bed for comfort.     Home Living Family/patient expects to be discharged to:: Private residence Living Arrangements: Children  Prior Function            PT Goals (current goals can now be found in the care plan section) Acute Rehab PT Goals PT Goal Formulation: With patient Time For Goal Achievement: 07/11/14 Potential to Achieve Goals: Good Progress towards PT goals: Progressing toward goals    Frequency  7X/week    PT Plan Current plan remains appropriate    Co-evaluation             End of Session Equipment  Utilized During Treatment: Right knee immobilizer Activity Tolerance: Patient tolerated treatment well Patient left: with family/visitor present;in bed;with call bell/phone within reach     Time: 7829-56211513-1549 PT Time Calculation (min): 36 min  Charges:  $Gait Training: 8-22 mins $Therapeutic Activity: 8-22 mins                    G Codes:     BJ's WholesaleLogan Secor Shuree Brossart, [place] [phone]  Berton MountBarbour, Challis Crill S 07/04/2014, 4:48 PM

## 2014-07-05 ENCOUNTER — Encounter (HOSPITAL_COMMUNITY): Payer: Self-pay | Admitting: Orthopedic Surgery

## 2014-07-05 LAB — CBC
HEMATOCRIT: 34.2 % — AB (ref 36.0–46.0)
Hemoglobin: 10.2 g/dL — ABNORMAL LOW (ref 12.0–15.0)
MCH: 26 pg (ref 26.0–34.0)
MCHC: 29.8 g/dL — AB (ref 30.0–36.0)
MCV: 87.2 fL (ref 78.0–100.0)
PLATELETS: 263 10*3/uL (ref 150–400)
RBC: 3.92 MIL/uL (ref 3.87–5.11)
RDW: 17 % — ABNORMAL HIGH (ref 11.5–15.5)
WBC: 20.7 10*3/uL — AB (ref 4.0–10.5)

## 2014-07-05 LAB — GLUCOSE, CAPILLARY
Glucose-Capillary: 152 mg/dL — ABNORMAL HIGH (ref 70–99)
Glucose-Capillary: 209 mg/dL — ABNORMAL HIGH (ref 70–99)

## 2014-07-05 LAB — BASIC METABOLIC PANEL
ANION GAP: 22 — AB (ref 5–15)
BUN: 14 mg/dL (ref 6–23)
CALCIUM: 8.7 mg/dL (ref 8.4–10.5)
CO2: 21 mEq/L (ref 19–32)
CREATININE: 0.79 mg/dL (ref 0.50–1.10)
Chloride: 93 mEq/L — ABNORMAL LOW (ref 96–112)
Glucose, Bld: 136 mg/dL — ABNORMAL HIGH (ref 70–99)
Potassium: 4.4 mEq/L (ref 3.7–5.3)
Sodium: 136 mEq/L — ABNORMAL LOW (ref 137–147)

## 2014-07-05 MED ORDER — ALUM & MAG HYDROXIDE-SIMETH 200-200-20 MG/5ML PO SUSP
30.0000 mL | Freq: Four times a day (QID) | ORAL | Status: DC | PRN
  Administered 2014-07-05: 30 mL via ORAL
  Filled 2014-07-05: qty 30

## 2014-07-05 MED ORDER — DIAZEPAM 5 MG PO TABS
5.0000 mg | ORAL_TABLET | Freq: Four times a day (QID) | ORAL | Status: DC | PRN
  Administered 2014-07-05: 5 mg via ORAL
  Filled 2014-07-05: qty 1

## 2014-07-05 NOTE — Progress Notes (Signed)
Subjective: 2 Days Post-Op Procedure(s) (LRB): TOTAL KNEE ARTHROPLASTY (Right) Patient reports pain as 8 on 0-10 scale.  Still a little nausea.  No vomiting.  No lightheadedness/dizziness.  Positive flatus but no bm as of yet.   Objective: Vital signs in last 24 hours: Temp:  [98.4 F (36.9 C)-100.1 F (37.8 C)] 98.8 F (37.1 C) (07/10 0500) Pulse Rate:  [110-128] 110 (07/10 0500) Resp:  [16] 16 (07/10 0500) BP: (123-132)/(83-100) 132/88 mmHg (07/10 0500) SpO2:  [91 %-95 %] 95 % (07/10 0500) Weight:  [102.059 kg (225 lb)] 102.059 kg (225 lb) (07/09 1500)  Intake/Output from previous day: 07/09 0701 - 07/10 0700 In: 1925 [P.O.:720; I.V.:1205] Out: -  Intake/Output this shift:     Recent Labs  07/04/14 0457  HGB 10.8*    Recent Labs  07/04/14 0457  WBC 16.3*  RBC 4.15  HCT 35.5*  PLT 276    Recent Labs  07/04/14 0457  NA 134*  K 5.1  CL 95*  CO2 26  BUN 9  CREATININE 0.70  GLUCOSE 188*  CALCIUM 8.5   No results found for this basename: LABPT, INR,  in the last 72 hours  Neurologically intact Neurovascular intact Sensation intact distally Intact pulses distally Dorsiflexion/Plantar flexion intact Incision: no drainage No cellulitis present Compartment soft Dressing changed by me today Negative homen's bilaterally  Assessment/Plan: 2 Days Post-Op Procedure(s) (LRB): TOTAL KNEE ARTHROPLASTY (Right) Advance diet Up with therapy Discharge home with home health WBAT RLE  Gearldine ShownNTON, M. LINDSEY 07/05/2014, 7:49 AM

## 2014-07-05 NOTE — Progress Notes (Signed)
Occupational Therapy Treatment and Discharge Patient Details Name: Quincy Sheehanonya L Alton MRN: [id number] DOB: 1966-03-21 Today's Date: 07/05/2014    History of present illness 48 y.o. female s/p right total knee arthroplasty. Hx of HTN, DM, GERD, and obesity.   OT comments  Focus of today's session was on practicing a tub transfer to the 3in1 and reviewing LB bathing/dressing technique. Pt is at a supervision level for transfers and will have 24/7 supervision with assistance as needed at home for BADLs, therefore no further OT is needed. We will sign off.   Follow Up Recommendations  Supervision/Assistance - 24 hour          Precautions / Restrictions Precautions Precautions: Knee Precaution Comments: Reviewed knee precautions Required Braces or Orthoses: Knee Immobilizer - Right Knee Immobilizer - Right: On except when in CPM Restrictions RLE Weight Bearing: Weight bearing as tolerated       Mobility Bed Mobility Overal bed mobility: Needs Assistance Bed Mobility: Supine to Sit     Supine to sit: Supervision     General bed mobility comments: for safety  Transfers Overall transfer level: Needs assistance Equipment used: Rolling walker (2 wheeled) Transfers: Sit to/from Stand Sit to Stand: Supervision         General transfer comment: Pt able to perform sit>stand with HOB flat from lowest bed level with S for safety and VC for hand placement    Balance   Sitting-balance support: No upper extremity supported;Feet supported Sitting balance-Leahy Scale: Good     Standing balance support: No upper extremity supported Standing balance-Leahy Scale: Fair                     ADL Overall ADL's : Needs assistance/impaired             Lower Body Bathing: Minimal assistance;Sit to/from stand (for feet)       Lower Body Dressing: Minimal assistance;Sit to/from stand (for underwear)           Tub/ Shower Transfer: Tub transfer;3 in 1;Rolling  walker;Ambulation;Minimal assistance   Functional mobility during ADLs: Rolling walker;Supervision/safety General ADL Comments: Educated and practiced tub transfer to 3in1 with pt and gave her a handout on technique; min A required for RLE only.                 Cognition   Behavior During Therapy: WFL for tasks assessed/performed Overall Cognitive Status: Within Functional Limits for tasks assessed                                    Pertinent Vitals/ Pain       No c/o pain, pt was medicated at the beginning of tx.         Frequency Min 2X/week     Progress Toward Goals  OT Goals(current goals can now be found in the care plan section)  Progress towards OT goals:  (All education completed)  Acute Rehab OT Goals Patient Stated Goal: to go home soon OT Goal Formulation: With patient Time For Goal Achievement: 07/11/14 Potential to Achieve Goals: Good  Plan Discharge plan remains appropriate       End of Session Equipment Utilized During Treatment: Rolling walker;Right knee immobilizer;Gait belt   Activity Tolerance Patient tolerated treatment well   Patient Left  (In gym with PT for stair training)           Time:  -  Charges:    Maurene Capes 07/05/2014, 9:42 AM

## 2014-07-05 NOTE — Progress Notes (Signed)
Physical Therapy Treatment Patient Details Name: Robin [name] MRN: [id number] DOB: [birth year] Today's Date: 07/05/2014    History of Present Illness 48 y.o. female s/p right total knee arthroplasty. Hx of HTN, DM, GERD, and obesity.    PT Comments    Pt tolerated treatment well and is very motivated to get back to ADLs and "get back in shape." Able to complete stair training this session. Pt planning to D/C this afternoon to home with son and daughter.   Follow Up Recommendations  Home health PT;Supervision for mobility/OOB     Equipment Recommendations  None recommended by PT    Recommendations for Other Services       Precautions / Restrictions Precautions Precautions: Knee Precaution Comments: Reviewed knee precautions Required Braces or Orthoses: Knee Immobilizer - Right Knee Immobilizer - Right: On when out of bed or walking Restrictions RLE Weight Bearing: Weight bearing as tolerated    Mobility  Bed Mobility Overal bed mobility: Needs Assistance Bed Mobility: Supine to Sit     Supine to sit: Supervision Sit to supine: Supervision   General bed mobility comments: pt did well with bed transfer and pulling herself up. supervision for safety and verbal cues for placement  Transfers Overall transfer level: Needs assistance Equipment used: Rolling walker (2 wheeled) Transfers: Sit to/from UGI CorporationStand;Stand Pivot Transfers Sit to Stand: Supervision         General transfer comment: pt able to do transfer on own with supervison for safety and verbal cues for hand placement.  Ambulation/Gait Ambulation/Gait assistance: Supervision Ambulation Distance (Feet): 55 Feet Assistive device: Rolling walker (2 wheeled) Gait Pattern/deviations: Step-to pattern;Decreased stride length;Antalgic Gait velocity: decreased Gait velocity interpretation: Below normal speed for age/gender General Gait Details: verbal cues for posture. limited due to pt having to use bathroom and  feeling very hot.    Stairs Stairs: Yes Stairs assistance: Supervision Stair Management: Two rails;Step to pattern;Forwards Number of Stairs: 10 General stair comments: verbal cues for safety and sequencing. Pt was able to tolerate and complete stair training very well.  Wheelchair Mobility    Modified Rankin (Stroke Patients Only)       Balance   Sitting-balance support: No upper extremity supported;Feet supported Sitting balance-Leahy Scale: Good     Standing balance support: No upper extremity supported Standing balance-Leahy Scale: Fair                      Cognition Arousal/Alertness: Awake/alert Behavior During Therapy: WFL for tasks assessed/performed Overall Cognitive Status: Within Functional Limits for tasks assessed                      Exercises Total Joint Exercises Ankle Circles/Pumps: AROM;20 reps;Both;Seated Quad Sets: AROM;Right;10 reps;Seated Heel Slides: AAROM;Right;Seated;5 reps Hip ABduction/ADduction: AAROM;Right;10 reps;Seated Straight Leg Raises: AAROM;Right;Seated;5 reps Long Arc Quad: AAROM;Right;10 reps;Seated    General Comments        Pertinent Vitals/Pain no apparent distress. Pt repositioned for comfort in bed in side lying.      Home Living                      Prior Function            PT Goals (current goals can now be found in the care plan section) Acute Rehab PT Goals Patient Stated Goal: to go home soon Progress towards PT goals: Progressing toward goals    Frequency  7X/week    PT Plan Current plan  remains appropriate    Co-evaluation             End of Session Equipment Utilized During Treatment: Gait belt;Right knee immobilizer Activity Tolerance: Patient tolerated treatment well Patient left: in bed;with call bell/phone within reach;with family/visitor present     Time: 0920-1009 PT Time Calculation (min): 49 min  Charges:  $Gait Training: 8-22 mins $Therapeutic  Exercise: 23-37 mins                    G Codes:      Fredrich Birks 07/05/2014, 10:20 AM 07/05/2014 Fredrich Birks PTA [phone] pager [phone] office

## 2014-07-05 NOTE — Progress Notes (Signed)
I have read and agree with this note.   Time in/out:839-920 Total time:41 minutes (3SC)  Ignacia Palmaathy Jarrod Bodkins, OTR/L [phone]

## 2014-07-05 NOTE — Progress Notes (Signed)
Physical Therapy Treatment Patient Details Name: Robin [name] MRN: [id number] DOB: [birth year] Today's Date: 07/05/2014    History of Present Illness 48 y.o. female s/p right total knee arthroplasty. Hx of HTN, DM, GERD, and obesity.    PT Comments    Pt needed increased time to get from supine to sitting and from sit to stand due to nausea. RN was in room getting pt ready for D/C to home with daughter.   Follow Up Recommendations  Home health PT;Supervision for mobility/OOB     Equipment Recommendations  None recommended by PT    Recommendations for Other Services       Precautions / Restrictions Precautions Precautions: Knee Required Braces or Orthoses: Knee Immobilizer - Right Knee Immobilizer - Right: On when out of bed or walking Restrictions RLE Weight Bearing: Weight bearing as tolerated    Mobility  Bed Mobility Overal bed mobility: Needs Assistance Bed Mobility: Supine to Sit     Supine to sit: Min assist     General bed mobility comments: Pt took a very long time going from supine to sit. pt needed assistance with moving RLE to come to sitting. verbal cues for UE placements.  Transfers Overall transfer level: Needs assistance Equipment used: Rolling walker (2 wheeled) Transfers: Sit to/from Stand Sit to Stand: Min assist         General transfer comment: pt needed min A coming from sit to stand to powerup from bed and safety. verbal cues for hand placement.  Ambulation/Gait Ambulation/Gait assistance: Min guard Ambulation Distance (Feet): 65 Feet Assistive device: Rolling walker (2 wheeled) Gait Pattern/deviations: Step-to pattern;Decreased stride length;Antalgic Gait velocity: decreased Gait velocity interpretation: Below normal speed for age/gender General Gait Details: limited due to pt being sore from CPM and nausea. Pt did well with remembering posture and sequencing.    Stairs            Wheelchair Mobility    Modified Rankin  (Stroke Patients Only)       Balance                                    Cognition Arousal/Alertness: Awake/alert Behavior During Therapy: WFL for tasks assessed/performed Overall Cognitive Status: Within Functional Limits for tasks assessed                      Exercises      General Comments        Pertinent Vitals/Pain no apparent distress. Pt repositioned for comfort in recliner with RN and daughter in room.      Home Living                      Prior Function            PT Goals (current goals can now be found in the care plan section) Progress towards PT goals: Progressing toward goals    Frequency  7X/week    PT Plan Current plan remains appropriate    Co-evaluation             End of Session Equipment Utilized During Treatment: Gait belt Activity Tolerance: Patient tolerated treatment well Patient left: in chair;with call bell/phone within reach;with family/visitor present;with nursing/sitter in room     Time: 1400-1447 PT Time Calculation (min): 47 min  Charges:  $Gait Training: 23-37 mins $Therapeutic Activity: 8-22 mins  G Codes:      Robin [name], Robin [name] 07/05/2014, 3:08 PM 07/05/2014 Robin Birksobinette, Fontaine Hehl [name] PTA [phone] pager [phone] office

## 2014-07-22 ENCOUNTER — Ambulatory Visit: Payer: MEDICAID | Admitting: Rehabilitation

## 2014-07-29 ENCOUNTER — Ambulatory Visit: Payer: Medicaid Other | Admitting: Rehabilitation

## 2014-07-31 ENCOUNTER — Ambulatory Visit: Payer: Medicaid Other | Attending: Orthopedic Surgery | Admitting: Physical Therapy

## 2014-07-31 DIAGNOSIS — M25669 Stiffness of unspecified knee, not elsewhere classified: Secondary | ICD-10-CM | POA: Insufficient documentation

## 2014-07-31 DIAGNOSIS — M25569 Pain in unspecified knee: Secondary | ICD-10-CM | POA: Insufficient documentation

## 2014-07-31 DIAGNOSIS — IMO0001 Reserved for inherently not codable concepts without codable children: Secondary | ICD-10-CM | POA: Insufficient documentation

## 2014-07-31 DIAGNOSIS — R269 Unspecified abnormalities of gait and mobility: Secondary | ICD-10-CM | POA: Insufficient documentation

## 2014-07-31 DIAGNOSIS — M6281 Muscle weakness (generalized): Secondary | ICD-10-CM | POA: Insufficient documentation

## 2014-08-20 ENCOUNTER — Other Ambulatory Visit: Payer: Self-pay | Admitting: Physician Assistant

## 2014-08-20 NOTE — H&P (Signed)
TOTAL KNEE ADMISSION H&P  Patient is being admitted for left total knee arthroplasty.  Subjective:  Chief Complaint:left knee pain.  HPI: Robin [name], 48 y.o. female, has a history of pain and functional disability in the left knee due to arthritis and has failed non-surgical conservative treatments for greater than 12 weeks to includeNSAID's and/or analgesics, corticosteriod injections, viscosupplementation injections, use of assistive devices and activity modification.  Onset of symptoms was gradual, starting 8 years ago with rapidlly worsening course since that time. The patient noted no past surgery on the left knee(s).  Patient currently rates pain in the left knee(s) at 9 out of 10 with activity. Patient has night pain, worsening of pain with activity and weight bearing, pain that interferes with activities of daily living, pain with passive range of motion, crepitus and joint swelling.  Patient has evidence of subchondral cysts, subchondral sclerosis and joint space narrowing by imaging studies. There is no active infection.  Patient Active Problem List   Diagnosis Date Noted  . DJD (degenerative joint disease) of knee 07/03/2014   Past Medical History  Diagnosis Date  . Hypertension   . Diabetes mellitus   . GERD (gastroesophageal reflux disease)   . Obesity   . Headache(784.0)     hx migraine  . Arthritis     Past Surgical History  Procedure Laterality Date  . Cholecystectomy    . Cesarean section    . Tubal ligation  1994  . Eye surgery Bilateral     cataracts  . Total knee arthroplasty Right 07/03/2014    Procedure: TOTAL KNEE ARTHROPLASTY;  Surgeon: Loreta Ave, MD;  Location: [facility] OR;  Service: Orthopedics;  Laterality: Right;     (Not in a hospital admission) Allergies  Allergen Reactions  . Sulfa Antibiotics Hives and Itching    History  Substance Use Topics  . Smoking status: Former Smoker -- 0.50 packs/day for 9 years    Quit date: 12/27/2009  .  Smokeless tobacco: Never Used  . Alcohol Use: No    No family history on file.   Review of Systems  Constitutional: Negative.   HENT: Negative.   Eyes: Negative.   Respiratory: Negative.   Cardiovascular: Negative.   Gastrointestinal: Negative.   Genitourinary: Negative.   Musculoskeletal: Positive for joint pain.  Skin: Negative.   Neurological: Negative.   Psychiatric/Behavioral: Negative.     Objective:  Physical Exam  Constitutional: She is oriented to person, place, and time. She appears well-developed and well-nourished.  HENT:  Head: Normocephalic and atraumatic.  Eyes: EOM are normal. Pupils are equal, round, and reactive to light.  Neck: Normal range of motion. Neck supple.  Cardiovascular: Normal rate, regular rhythm and normal heart sounds.  Exam reveals no gallop and no friction rub.   No murmur heard. Respiratory: Effort normal and breath sounds normal. No respiratory distress. She has no wheezes. She has no rales.  GI: Soft. Bowel sounds are normal.  Musculoskeletal:  She comes today with crutches. Examination of her left knee reveals range of motion from about 0 to 95 degrees. Medial joint line tenderness to palpation.  Ligaments are stable.  Minimal patellofemoral crepitus.  She does have 5 degrees of varus and 5 degrees of flexion contracture as well.  Negative log roll.  Negative straight leg raise.  She is neurovascularly intact distally.    Neurological: She is alert and oriented to person, place, and time.  Skin: Skin is warm and dry.  Psychiatric: She has  a normal mood and affect. Her behavior is normal. Judgment and thought content normal.    Vital signs in last 24 hours: @  Labs:   Estimated body mass index is 37.28 kg/(m^2) as calculated from the following:   Height as of 07/04/14:  (1.651 m).   Weight as of 06/26/14: 101.606 kg (224 lb).   Imaging Review Plain radiographs demonstrate severe degenerative joint disease of the left  knee(s). The overall alignment ismild varus. The bone quality appears to be fair for age and reported activity level.  Assessment/Plan:  End stage arthritis, left knee   The patient history, physical examination, clinical judgment of the provider and imaging studies are consistent with end stage degenerative joint disease of the left knee(s) and total knee arthroplasty is deemed medically necessary. The treatment options including medical management, injection therapy arthroscopy and arthroplasty were discussed at length. The risks and benefits of total knee arthroplasty were presented and reviewed. The risks due to aseptic loosening, infection, stiffness, patella tracking problems, thromboembolic complications and other imponderables were discussed. The patient acknowledged the explanation, agreed to proceed with the plan and consent was signed. Patient is being admitted for inpatient treatment for surgery, pain control, PT, OT, prophylactic antibiotics, VTE prophylaxis, progressive ambulation and ADL's and discharge planning. The patient is planning to be discharged home with home health services

## 2014-08-22 ENCOUNTER — Encounter (HOSPITAL_COMMUNITY): Payer: Self-pay | Admitting: Pharmacy Technician

## 2014-08-26 ENCOUNTER — Encounter (HOSPITAL_COMMUNITY)
Admission: RE | Admit: 2014-08-26 | Discharge: 2014-08-26 | Disposition: A | Discharge status: Home / Self Care | Payer: Medicaid Other | Source: Ambulatory Visit | Attending: Orthopedic Surgery | Admitting: Orthopedic Surgery

## 2014-08-26 ENCOUNTER — Encounter (HOSPITAL_COMMUNITY): Payer: Self-pay

## 2014-08-26 DIAGNOSIS — Z01818 Encounter for other preprocedural examination: Secondary | ICD-10-CM | POA: Diagnosis present

## 2014-08-26 DIAGNOSIS — Z981 Arthrodesis status: Secondary | ICD-10-CM | POA: Insufficient documentation

## 2014-08-26 DIAGNOSIS — T85890A Other specified complication of nervous system prosthetic devices, implants and grafts, initial encounter: Secondary | ICD-10-CM | POA: Diagnosis not present

## 2014-08-26 DIAGNOSIS — M1710 Unilateral primary osteoarthritis, unspecified knee: Secondary | ICD-10-CM | POA: Insufficient documentation

## 2014-08-26 LAB — CBC WITH DIFFERENTIAL/PLATELET
BASOS ABS: 0 10*3/uL (ref 0.0–0.1)
BASOS PCT: 0 % (ref 0–1)
Eosinophils Absolute: 0.2 10*3/uL (ref 0.0–0.7)
Eosinophils Relative: 2 % (ref 0–5)
HCT: 38.9 % (ref 36.0–46.0)
Hemoglobin: 11.5 g/dL — ABNORMAL LOW (ref 12.0–15.0)
Lymphocytes Relative: 24 % (ref 12–46)
Lymphs Abs: 2.4 10*3/uL (ref 0.7–4.0)
MCH: 24 pg — AB (ref 26.0–34.0)
MCHC: 29.6 g/dL — ABNORMAL LOW (ref 30.0–36.0)
MCV: 81.2 fL (ref 78.0–100.0)
Monocytes Absolute: 0.8 10*3/uL (ref 0.1–1.0)
Monocytes Relative: 8 % (ref 3–12)
NEUTROS ABS: 6.9 10*3/uL (ref 1.7–7.7)
NEUTROS PCT: 66 % (ref 43–77)
PLATELETS: 223 10*3/uL (ref 150–400)
RBC: 4.79 MIL/uL (ref 3.87–5.11)
RDW: 16.4 % — ABNORMAL HIGH (ref 11.5–15.5)
WBC: 10.4 10*3/uL (ref 4.0–10.5)

## 2014-08-26 LAB — COMPREHENSIVE METABOLIC PANEL
ALBUMIN: 3.5 g/dL (ref 3.5–5.2)
ALK PHOS: 93 U/L (ref 39–117)
ALT: 16 U/L (ref 0–35)
ANION GAP: 14 (ref 5–15)
AST: 28 U/L (ref 0–37)
BUN: 7 mg/dL (ref 6–23)
CHLORIDE: 100 meq/L (ref 96–112)
CO2: 26 mEq/L (ref 19–32)
Calcium: 9.5 mg/dL (ref 8.4–10.5)
Creatinine, Ser: 0.66 mg/dL (ref 0.50–1.10)
GFR calc Af Amer: >90 mL/min (ref >90)
GFR calc non Af Amer: >90 mL/min (ref >90)
Glucose, Bld: 155 mg/dL — ABNORMAL HIGH (ref 70–99)
POTASSIUM: 3.9 meq/L (ref 3.7–5.3)
SODIUM: 140 meq/L (ref 137–147)
TOTAL PROTEIN: 7.7 g/dL (ref 6.0–8.3)
Total Bilirubin: 0.3 mg/dL (ref 0.3–1.2)

## 2014-08-26 LAB — TYPE AND SCREEN
ABO/RH(D): A NEG
Antibody Screen: NEGATIVE

## 2014-08-26 LAB — URINALYSIS, ROUTINE W REFLEX MICROSCOPIC
Bilirubin Urine: NEGATIVE
Glucose, UA: >1000 mg/dL — AB
HGB URINE DIPSTICK: NEGATIVE
Ketones, ur: NEGATIVE mg/dL
Leukocytes, UA: NEGATIVE
Nitrite: NEGATIVE
PROTEIN: NEGATIVE mg/dL
Specific Gravity, Urine: 1.037 — ABNORMAL HIGH (ref 1.005–1.030)
UROBILINOGEN UA: 0.2 mg/dL (ref 0.0–1.0)
pH: 5 (ref 5.0–8.0)

## 2014-08-26 LAB — HCG, SERUM, QUALITATIVE: Preg, Serum: NEGATIVE

## 2014-08-26 LAB — URINE MICROSCOPIC-ADD ON

## 2014-08-26 LAB — PROTIME-INR
INR: 1.05 (ref 0.00–1.49)
Prothrombin Time: 13.7 seconds (ref 11.6–15.2)

## 2014-08-26 LAB — SURGICAL PCR SCREEN
MRSA, PCR: NEGATIVE
Staphylococcus aureus: NEGATIVE

## 2014-08-26 LAB — APTT: APTT: 32 s (ref 24–37)

## 2014-08-26 NOTE — Pre-Procedure Instructions (Signed)
Robin [name]  08/26/2014   Your procedure is scheduled on:  Wednesday September 04, 2014 at 1:00 PM.  Report to [facility] Admitting at 11:00 AM.  Call this number if you have problems the morning of surgery: [phone]   Remember:   Do not eat food or drink liquids after midnight.   Take these medicines the morning of surgery with A SIP OF WATER: Albuterol inhaler if needed, Omeprazole (Prilosec), Onansetron (Zofran) if needed, and Oxycodone (Percocet) if needed   Do NOT take any diabetic medications the morning of your surgery   Do not wear jewelry, make-up or nail polish.  Do not wear lotions, powders, or perfumes.   Do not shave 48 hours prior to surgery.   Do not bring valuables to the hospital.  [facility] is not responsible for any belongings or valuables.               Contacts, dentures or bridgework may not be worn into surgery.  Leave suitcase in the car. After surgery it may be brought to your room.  For patients admitted to the hospital, discharge time is determined by your treatment team.               Patients discharged the day of surgery will not be allowed to drive home.  Name and phone number of your driver: Family/Friend  Special Instructions: Shower the night before and the morning of your surgery using CHG soap   Please read over the following fact sheets that you were given: Pain Booklet, Coughing and Deep Breathing, Blood Transfusion Information, Total Joint Packet, MRSA Information and Surgical Site Infection Prevention

## 2014-08-27 LAB — URINE CULTURE: Colony Count: 10000

## 2014-08-27 NOTE — Progress Notes (Signed)
Anesthesia chart review: Patient is a 48 year old female scheduled for left TKR 09/04/14 by Dr. Mckinley Jewel.   History includes former smoker, hypertension, diabetes mellitus type 2, GERD, migraine headaches, arthritis, cholecystectomy, right TKR 7/087/15. BMI is consistent with obesity. PCP is listed as Primus Bravo, NP with [facility].   EKG on 06/20/14 showed ST at 105 bpm, LAD, poor precordial r wave progression, possible anterior infarct (age undetermined). Currently there are no comparison EKGs available in [place], Epic, or at her PCP office.   Chest x-ray on 06/20/14 showed no acute cardiopulmonary abnormality.   Preoperative labs noted.   She tolerated right TKR since her 06/20/14 EKG.  She is scheduled for same procedure on the left.  If no acute changes then I would anticipate that she could proceed as planned.  Velna Ochs [facility] Short Stay Center/Anesthesiology Phone [phone] 08/27/2014 3:44 PM

## 2014-09-03 MED ORDER — CEFAZOLIN SODIUM-DEXTROSE 2-3 GM-% IV SOLR
2.0000 g | INTRAVENOUS | Status: AC
  Administered 2014-09-04: 2 g via INTRAVENOUS
  Filled 2014-09-03: qty 50

## 2014-09-03 NOTE — Progress Notes (Signed)
Patient notified to arrive at 09:30 verbalized understanding.

## 2014-09-04 ENCOUNTER — Inpatient Hospital Stay (HOSPITAL_COMMUNITY): Payer: Medicaid Other

## 2014-09-04 ENCOUNTER — Encounter (HOSPITAL_COMMUNITY): Payer: Medicaid Other | Admitting: Vascular Surgery

## 2014-09-04 ENCOUNTER — Inpatient Hospital Stay (HOSPITAL_COMMUNITY): Payer: Medicaid Other | Admitting: Anesthesiology

## 2014-09-04 ENCOUNTER — Inpatient Hospital Stay (HOSPITAL_COMMUNITY)
Admission: RE | Admit: 2014-09-04 | Discharge: 2014-09-06 | DRG: 470 | Disposition: A | Discharge status: Home Health | Payer: Medicaid Other | Source: Ambulatory Visit | Attending: Orthopedic Surgery | Admitting: Orthopedic Surgery

## 2014-09-04 ENCOUNTER — Encounter (HOSPITAL_COMMUNITY): Payer: Self-pay | Admitting: Anesthesiology

## 2014-09-04 ENCOUNTER — Encounter (HOSPITAL_COMMUNITY)
Admission: RE | Disposition: A | Discharge status: Home Health | Payer: Self-pay | Source: Ambulatory Visit | Attending: Orthopedic Surgery

## 2014-09-04 DIAGNOSIS — Z87891 Personal history of nicotine dependence: Secondary | ICD-10-CM

## 2014-09-04 DIAGNOSIS — M25569 Pain in unspecified knee: Secondary | ICD-10-CM | POA: Diagnosis present

## 2014-09-04 DIAGNOSIS — D62 Acute posthemorrhagic anemia: Secondary | ICD-10-CM | POA: Diagnosis not present

## 2014-09-04 DIAGNOSIS — E119 Type 2 diabetes mellitus without complications: Secondary | ICD-10-CM | POA: Diagnosis present

## 2014-09-04 DIAGNOSIS — M1710 Unilateral primary osteoarthritis, unspecified knee: Secondary | ICD-10-CM | POA: Diagnosis present

## 2014-09-04 DIAGNOSIS — Z882 Allergy status to sulfonamides status: Secondary | ICD-10-CM

## 2014-09-04 DIAGNOSIS — E669 Obesity, unspecified: Secondary | ICD-10-CM | POA: Diagnosis present

## 2014-09-04 DIAGNOSIS — Z9851 Tubal ligation status: Secondary | ICD-10-CM

## 2014-09-04 DIAGNOSIS — Z6838 Body mass index (BMI) 38.0-38.9, adult: Secondary | ICD-10-CM

## 2014-09-04 DIAGNOSIS — Z7982 Long term (current) use of aspirin: Secondary | ICD-10-CM

## 2014-09-04 DIAGNOSIS — R110 Nausea: Secondary | ICD-10-CM | POA: Diagnosis not present

## 2014-09-04 DIAGNOSIS — K219 Gastro-esophageal reflux disease without esophagitis: Secondary | ICD-10-CM | POA: Diagnosis present

## 2014-09-04 DIAGNOSIS — Z79899 Other long term (current) drug therapy: Secondary | ICD-10-CM

## 2014-09-04 DIAGNOSIS — Z794 Long term (current) use of insulin: Secondary | ICD-10-CM | POA: Diagnosis not present

## 2014-09-04 DIAGNOSIS — G8918 Other acute postprocedural pain: Secondary | ICD-10-CM | POA: Diagnosis not present

## 2014-09-04 DIAGNOSIS — I10 Essential (primary) hypertension: Secondary | ICD-10-CM | POA: Diagnosis present

## 2014-09-04 DIAGNOSIS — Z96659 Presence of unspecified artificial knee joint: Secondary | ICD-10-CM

## 2014-09-04 DIAGNOSIS — M179 Osteoarthritis of knee, unspecified: Secondary | ICD-10-CM | POA: Diagnosis present

## 2014-09-04 DIAGNOSIS — M1712 Unilateral primary osteoarthritis, left knee: Secondary | ICD-10-CM

## 2014-09-04 HISTORY — PX: TOTAL KNEE ARTHROPLASTY: SHX125

## 2014-09-04 LAB — GLUCOSE, CAPILLARY
GLUCOSE-CAPILLARY: 174 mg/dL — AB (ref 70–99)
Glucose-Capillary: 181 mg/dL — ABNORMAL HIGH (ref 70–99)
Glucose-Capillary: 157 mg/dL — ABNORMAL HIGH (ref 70–99)
Glucose-Capillary: 159 mg/dL — ABNORMAL HIGH (ref 70–99)

## 2014-09-04 SURGERY — ARTHROPLASTY, KNEE, TOTAL
Anesthesia: General | Site: Knee | Laterality: Left

## 2014-09-04 MED ORDER — PROPOFOL 10 MG/ML IV BOLUS
INTRAVENOUS | Status: AC
  Filled 2014-09-04: qty 20

## 2014-09-04 MED ORDER — BUPIVACAINE LIPOSOME 1.3 % IJ SUSP
20.0000 mL | Freq: Once | INTRAMUSCULAR | Status: DC
  Filled 2014-09-04: qty 20

## 2014-09-04 MED ORDER — PROMETHAZINE HCL 25 MG/ML IJ SOLN
6.2500 mg | Freq: Once | INTRAMUSCULAR | Status: DC | PRN
  Administered 2014-09-04: 6.25 mg via INTRAVENOUS

## 2014-09-04 MED ORDER — ALBUTEROL SULFATE (2.5 MG/3ML) 0.083% IN NEBU: 2.5000 mg | INHALATION_SOLUTION | Freq: Four times a day (QID) | RESPIRATORY_TRACT | Status: DC | PRN

## 2014-09-04 MED ORDER — ONDANSETRON HCL 4 MG PO TABS: 4.0000 mg | ORAL_TABLET | Freq: Four times a day (QID) | ORAL | Status: DC | PRN

## 2014-09-04 MED ORDER — KETOROLAC TROMETHAMINE 30 MG/ML IJ SOLN
INTRAMUSCULAR | Status: DC | PRN
  Administered 2014-09-04: 30 mg via INTRAVENOUS

## 2014-09-04 MED ORDER — LISINOPRIL-HYDROCHLOROTHIAZIDE 20-12.5 MG PO TABS: 1.0000 | ORAL_TABLET | Freq: Every day | ORAL | Status: DC

## 2014-09-04 MED ORDER — ACETAMINOPHEN 10 MG/ML IV SOLN
INTRAVENOUS | Status: DC | PRN
  Administered 2014-09-04: 1000 mg via INTRAVENOUS

## 2014-09-04 MED ORDER — ARTIFICIAL TEARS OP OINT
TOPICAL_OINTMENT | OPHTHALMIC | Status: AC
  Filled 2014-09-04: qty 3.5

## 2014-09-04 MED ORDER — BUPIVACAINE HCL (PF) 0.25 % IJ SOLN
INTRAMUSCULAR | Status: AC
  Filled 2014-09-04: qty 30

## 2014-09-04 MED ORDER — PANTOPRAZOLE SODIUM 40 MG PO TBEC
40.0000 mg | DELAYED_RELEASE_TABLET | Freq: Every day | ORAL | Status: DC
  Administered 2014-09-05 – 2014-09-06 (×2): 40 mg via ORAL
  Filled 2014-09-04 (×3): qty 1

## 2014-09-04 MED ORDER — CHLORHEXIDINE GLUCONATE 4 % EX LIQD
60.0000 mL | Freq: Once | CUTANEOUS | Status: DC
Start: 2014-09-04 — End: 2014-09-04
  Filled 2014-09-04: qty 60

## 2014-09-04 MED ORDER — PROPOFOL 10 MG/ML IV BOLUS
INTRAVENOUS | Status: DC | PRN
  Administered 2014-09-04: 160 mg via INTRAVENOUS
  Administered 2014-09-04 (×2): 20 mg via INTRAVENOUS

## 2014-09-04 MED ORDER — ONDANSETRON HCL 4 MG/2ML IJ SOLN
INTRAMUSCULAR | Status: AC
  Filled 2014-09-04: qty 2

## 2014-09-04 MED ORDER — MIDAZOLAM HCL 5 MG/5ML IJ SOLN
INTRAMUSCULAR | Status: DC | PRN
  Administered 2014-09-04 (×3): 1 mg via INTRAVENOUS

## 2014-09-04 MED ORDER — HYDROMORPHONE HCL PF 1 MG/ML IJ SOLN
INTRAMUSCULAR | Status: AC
  Administered 2014-09-04: 0.5 mg via INTRAVENOUS
  Filled 2014-09-04: qty 1

## 2014-09-04 MED ORDER — ONDANSETRON HCL 4 MG/2ML IJ SOLN
INTRAMUSCULAR | Status: DC | PRN
  Administered 2014-09-04: 4 mg via INTRAVENOUS

## 2014-09-04 MED ORDER — BUPIVACAINE LIPOSOME 1.3 % IJ SUSP
INTRAMUSCULAR | Status: DC | PRN
  Administered 2014-09-04: 20 mL

## 2014-09-04 MED ORDER — DEXTROSE 5 % IV SOLN
INTRAVENOUS | Status: DC | PRN
  Administered 2014-09-04: 11:00:00 via INTRAVENOUS

## 2014-09-04 MED ORDER — POTASSIUM CHLORIDE IN NACL 20-0.9 MEQ/L-% IV SOLN
INTRAVENOUS | Status: DC
  Administered 2014-09-04 – 2014-09-05 (×2): via INTRAVENOUS
  Filled 2014-09-04 (×5): qty 1000

## 2014-09-04 MED ORDER — ONDANSETRON HCL 4 MG/2ML IJ SOLN
4.0000 mg | Freq: Four times a day (QID) | INTRAMUSCULAR | Status: DC | PRN
  Administered 2014-09-04 – 2014-09-06 (×2): 4 mg via INTRAVENOUS
  Filled 2014-09-04 (×2): qty 2

## 2014-09-04 MED ORDER — HYDROMORPHONE HCL PF 1 MG/ML IJ SOLN
0.2500 mg | INTRAMUSCULAR | Status: DC | PRN
  Administered 2014-09-04 (×4): 0.5 mg via INTRAVENOUS

## 2014-09-04 MED ORDER — FENTANYL CITRATE 0.05 MG/ML IJ SOLN
INTRAMUSCULAR | Status: AC
  Filled 2014-09-04: qty 5

## 2014-09-04 MED ORDER — ASPIRIN EC 325 MG PO TBEC
325.0000 mg | DELAYED_RELEASE_TABLET | Freq: Every day | ORAL | Status: DC
  Administered 2014-09-05 – 2014-09-06 (×2): 325 mg via ORAL
  Filled 2014-09-04 (×4): qty 1

## 2014-09-04 MED ORDER — ASPIRIN EC 325 MG PO TBEC: 325.0000 mg | DELAYED_RELEASE_TABLET | Freq: Every day | ORAL | Status: DC

## 2014-09-04 MED ORDER — KETOROLAC TROMETHAMINE 30 MG/ML IJ SOLN
INTRAMUSCULAR | Status: AC
  Filled 2014-09-04: qty 1

## 2014-09-04 MED ORDER — HYDROMORPHONE HCL PF 1 MG/ML IJ SOLN
INTRAMUSCULAR | Status: DC | PRN
  Administered 2014-09-04 (×5): .2 mg via INTRAVENOUS

## 2014-09-04 MED ORDER — HYDROMORPHONE HCL 2 MG PO TABS: 2.0000 mg | ORAL_TABLET | ORAL | Status: DC | PRN

## 2014-09-04 MED ORDER — ACETAMINOPHEN 10 MG/ML IV SOLN
1000.0000 mg | Freq: Once | INTRAVENOUS | Status: DC
  Filled 2014-09-04: qty 100

## 2014-09-04 MED ORDER — LIDOCAINE HCL (CARDIAC) 20 MG/ML IV SOLN
INTRAVENOUS | Status: AC
  Filled 2014-09-04: qty 5

## 2014-09-04 MED ORDER — ONDANSETRON HCL 4 MG PO TABS: 4.0000 mg | ORAL_TABLET | Freq: Three times a day (TID) | ORAL | Status: DC | PRN

## 2014-09-04 MED ORDER — HYDROCHLOROTHIAZIDE 12.5 MG PO CAPS
12.5000 mg | ORAL_CAPSULE | Freq: Every day | ORAL | Status: DC
  Administered 2014-09-04 – 2014-09-06 (×3): 12.5 mg via ORAL
  Filled 2014-09-04 (×3): qty 1

## 2014-09-04 MED ORDER — DOCUSATE SODIUM 100 MG PO CAPS
100.0000 mg | ORAL_CAPSULE | Freq: Two times a day (BID) | ORAL | Status: DC
  Administered 2014-09-04 – 2014-09-06 (×4): 100 mg via ORAL
  Filled 2014-09-04 (×6): qty 1

## 2014-09-04 MED ORDER — 0.9 % SODIUM CHLORIDE (POUR BTL) OPTIME
TOPICAL | Status: DC | PRN
  Administered 2014-09-04: 1000 mL

## 2014-09-04 MED ORDER — METHOCARBAMOL 500 MG PO TABS
500.0000 mg | ORAL_TABLET | Freq: Four times a day (QID) | ORAL | Status: DC | PRN
  Administered 2014-09-05 – 2014-09-06 (×5): 500 mg via ORAL
  Filled 2014-09-04 (×5): qty 1

## 2014-09-04 MED ORDER — FENTANYL CITRATE 0.05 MG/ML IJ SOLN
INTRAMUSCULAR | Status: DC | PRN
  Administered 2014-09-04 (×7): 50 ug via INTRAVENOUS

## 2014-09-04 MED ORDER — OXYCODONE HCL 5 MG/5ML PO SOLN: 5.0000 mg | Freq: Once | ORAL | Status: DC | PRN

## 2014-09-04 MED ORDER — LACTATED RINGERS IV SOLN: INTRAVENOUS | Status: DC

## 2014-09-04 MED ORDER — ACETAMINOPHEN 160 MG/5ML PO SOLN
325.0000 mg | ORAL | Status: DC | PRN
  Filled 2014-09-04: qty 20.3

## 2014-09-04 MED ORDER — PROMETHAZINE HCL 25 MG/ML IJ SOLN
INTRAMUSCULAR | Status: AC
  Administered 2014-09-04: 6.25 mg via INTRAVENOUS
  Filled 2014-09-04: qty 1

## 2014-09-04 MED ORDER — BUPIVACAINE HCL (PF) 0.25 % IJ SOLN
INTRAMUSCULAR | Status: DC | PRN
  Administered 2014-09-04: 10 mL

## 2014-09-04 MED ORDER — KETAMINE HCL 100 MG/ML IJ SOLN
INTRAMUSCULAR | Status: AC
  Filled 2014-09-04: qty 1

## 2014-09-04 MED ORDER — SODIUM CHLORIDE 0.9 % IR SOLN
Status: DC | PRN
  Administered 2014-09-04: 3000 mL

## 2014-09-04 MED ORDER — LACTATED RINGERS IV SOLN
INTRAVENOUS | Status: DC
  Administered 2014-09-04: 10:00:00 via INTRAVENOUS

## 2014-09-04 MED ORDER — ACETAMINOPHEN 325 MG PO TABS: 325.0000 mg | ORAL_TABLET | ORAL | Status: DC | PRN

## 2014-09-04 MED ORDER — SODIUM CHLORIDE 0.9 % IJ SOLN
INTRAMUSCULAR | Status: DC | PRN
  Administered 2014-09-04: 40 mL

## 2014-09-04 MED ORDER — SODIUM CHLORIDE 0.9 % IJ SOLN
INTRAMUSCULAR | Status: AC
  Filled 2014-09-04: qty 10

## 2014-09-04 MED ORDER — DIPHENHYDRAMINE HCL 12.5 MG/5ML PO ELIX: 12.5000 mg | ORAL_SOLUTION | ORAL | Status: DC | PRN

## 2014-09-04 MED ORDER — INSULIN ASPART 100 UNIT/ML ~~LOC~~ SOLN: 0.0000 [IU] | Freq: Every day | SUBCUTANEOUS | Status: DC

## 2014-09-04 MED ORDER — CHLORHEXIDINE GLUCONATE 4 % EX LIQD
60.0000 mL | Freq: Once | CUTANEOUS | Status: DC
  Filled 2014-09-04: qty 60

## 2014-09-04 MED ORDER — BISACODYL 5 MG PO TBEC: 5.0000 mg | DELAYED_RELEASE_TABLET | Freq: Every day | ORAL | Status: DC | PRN

## 2014-09-04 MED ORDER — OXYCODONE HCL 5 MG PO TABS: 10.0000 mg | ORAL_TABLET | Freq: Once | ORAL | Status: DC | PRN

## 2014-09-04 MED ORDER — DEXTROSE 5 % IV SOLN
500.0000 mg | Freq: Four times a day (QID) | INTRAVENOUS | Status: DC | PRN
  Filled 2014-09-04: qty 5

## 2014-09-04 MED ORDER — KETAMINE HCL 10 MG/ML IJ SOLN
INTRAMUSCULAR | Status: DC | PRN
  Administered 2014-09-04: 30 mg via INTRAVENOUS
  Administered 2014-09-04: 20 mg via INTRAVENOUS

## 2014-09-04 MED ORDER — METOCLOPRAMIDE HCL 10 MG PO TABS: 5.0000 mg | ORAL_TABLET | Freq: Three times a day (TID) | ORAL | Status: DC | PRN

## 2014-09-04 MED ORDER — LACTATED RINGERS IV SOLN
INTRAVENOUS | Status: DC | PRN
  Administered 2014-09-04 (×2): via INTRAVENOUS

## 2014-09-04 MED ORDER — LIDOCAINE HCL (CARDIAC) 20 MG/ML IV SOLN
INTRAVENOUS | Status: DC | PRN
  Administered 2014-09-04: 80 mg via INTRAVENOUS

## 2014-09-04 MED ORDER — HYDROMORPHONE HCL PF 1 MG/ML IJ SOLN
INTRAMUSCULAR | Status: AC
  Filled 2014-09-04: qty 1

## 2014-09-04 MED ORDER — PHENOL 1.4 % MT LIQD: 1.0000 | OROMUCOSAL | Status: DC | PRN

## 2014-09-04 MED ORDER — HYDROMORPHONE HCL PF 1 MG/ML IJ SOLN
0.5000 mg | INTRAMUSCULAR | Status: DC | PRN
  Administered 2014-09-04 – 2014-09-06 (×15): 1 mg via INTRAVENOUS
  Filled 2014-09-04 (×15): qty 1

## 2014-09-04 MED ORDER — MIDAZOLAM HCL 2 MG/2ML IJ SOLN
INTRAMUSCULAR | Status: AC
  Filled 2014-09-04: qty 2

## 2014-09-04 MED ORDER — CEFAZOLIN SODIUM-DEXTROSE 2-3 GM-% IV SOLR
2.0000 g | Freq: Four times a day (QID) | INTRAVENOUS | Status: AC
  Administered 2014-09-04 (×2): 2 g via INTRAVENOUS
  Filled 2014-09-04 (×2): qty 50

## 2014-09-04 MED ORDER — OXYCODONE HCL 5 MG PO TABS
5.0000 mg | ORAL_TABLET | ORAL | Status: DC | PRN
  Administered 2014-09-05 – 2014-09-06 (×11): 10 mg via ORAL
  Filled 2014-09-04 (×11): qty 2

## 2014-09-04 MED ORDER — INSULIN ASPART 100 UNIT/ML ~~LOC~~ SOLN
0.0000 [IU] | Freq: Three times a day (TID) | SUBCUTANEOUS | Status: DC
  Administered 2014-09-04 – 2014-09-06 (×5): 2 [IU] via SUBCUTANEOUS
  Administered 2014-09-06: 3 [IU] via SUBCUTANEOUS

## 2014-09-04 MED ORDER — METHOCARBAMOL 500 MG PO TABS: 500.0000 mg | ORAL_TABLET | Freq: Four times a day (QID) | ORAL | Status: DC

## 2014-09-04 MED ORDER — MENTHOL 3 MG MT LOZG: 1.0000 | LOZENGE | OROMUCOSAL | Status: DC | PRN

## 2014-09-04 MED ORDER — ALBUTEROL SULFATE HFA 108 (90 BASE) MCG/ACT IN AERS: 2.0000 | INHALATION_SPRAY | Freq: Four times a day (QID) | RESPIRATORY_TRACT | Status: DC | PRN

## 2014-09-04 MED ORDER — LISINOPRIL 20 MG PO TABS
20.0000 mg | ORAL_TABLET | Freq: Every day | ORAL | Status: DC
  Administered 2014-09-04 – 2014-09-06 (×3): 20 mg via ORAL
  Filled 2014-09-04 (×3): qty 1

## 2014-09-04 MED ORDER — ACETAMINOPHEN 325 MG PO TABS
650.0000 mg | ORAL_TABLET | Freq: Four times a day (QID) | ORAL | Status: DC | PRN
  Administered 2014-09-05: 650 mg via ORAL
  Filled 2014-09-04: qty 2

## 2014-09-04 MED ORDER — ACETAMINOPHEN 650 MG RE SUPP: 650.0000 mg | Freq: Four times a day (QID) | RECTAL | Status: DC | PRN

## 2014-09-04 MED ORDER — METOCLOPRAMIDE HCL 5 MG/ML IJ SOLN: 5.0000 mg | Freq: Three times a day (TID) | INTRAMUSCULAR | Status: DC | PRN

## 2014-09-04 SURGICAL SUPPLY — 62 items
BANDAGE ELASTIC 4 VELCRO ST LF (GAUZE/BANDAGES/DRESSINGS) ×3 IMPLANT
BANDAGE ELASTIC 6 VELCRO ST LF (GAUZE/BANDAGES/DRESSINGS) ×3 IMPLANT
BANDAGE ESMARK 6X9 LF (GAUZE/BANDAGES/DRESSINGS) ×1 IMPLANT
BENZOIN TINCTURE PRP APPL 2/3 (GAUZE/BANDAGES/DRESSINGS) ×3 IMPLANT
BLADE SAG 18X100X1.27 (BLADE) ×6 IMPLANT
BLADE SURG 10 STRL SS (BLADE) ×3 IMPLANT
BNDG ESMARK 6X9 LF (GAUZE/BANDAGES/DRESSINGS) ×3
BOWL SMART MIX CTS (DISPOSABLE) ×3 IMPLANT
CEMENT BONE SIMPLEX SPEEDSET (Cement) ×6 IMPLANT
CLOSURE WOUND 1/2 X4 (GAUZE/BANDAGES/DRESSINGS) ×1
COVER SURGICAL LIGHT HANDLE (MISCELLANEOUS) ×3 IMPLANT
CUFF TOURNIQUET SINGLE 34IN LL (TOURNIQUET CUFF) ×3 IMPLANT
DRAPE EXTREMITY T 121X128X90 (DRAPE) ×3 IMPLANT
DRAPE PROXIMA HALF (DRAPES) ×3 IMPLANT
DRAPE U-SHAPE 47X51 STRL (DRAPES) ×3 IMPLANT
DRSG PAD ABDOMINAL 8X10 ST (GAUZE/BANDAGES/DRESSINGS) ×3 IMPLANT
DURAPREP 26ML APPLICATOR (WOUND CARE) ×6 IMPLANT
ELECT CAUTERY BLADE 6.4 (BLADE) ×3 IMPLANT
ELECT REM PT RETURN 9FT ADLT (ELECTROSURGICAL) ×3
ELECTRODE REM PT RTRN 9FT ADLT (ELECTROSURGICAL) ×1 IMPLANT
EVACUATOR 1/8 PVC DRAIN (DRAIN) ×3 IMPLANT
FACESHIELD WRAPAROUND (MASK) ×6 IMPLANT
GAUZE SPONGE 4X4 12PLY STRL (GAUZE/BANDAGES/DRESSINGS) ×3 IMPLANT
GLOVE BIOGEL PI IND STRL 7.0 (GLOVE) ×2 IMPLANT
GLOVE BIOGEL PI INDICATOR 7.0 (GLOVE) ×4
GLOVE ECLIPSE 6.5 STRL STRAW (GLOVE) ×6 IMPLANT
GLOVE ORTHO TXT STRL SZ7.5 (GLOVE) ×3 IMPLANT
GOWN STRL REUS W/ TWL LRG LVL3 (GOWN DISPOSABLE) ×3 IMPLANT
GOWN STRL REUS W/ TWL XL LVL3 (GOWN DISPOSABLE) ×1 IMPLANT
GOWN STRL REUS W/TWL LRG LVL3 (GOWN DISPOSABLE) ×6
GOWN STRL REUS W/TWL XL LVL3 (GOWN DISPOSABLE) ×2
HANDPIECE INTERPULSE COAX TIP (DISPOSABLE) ×2
IMMOBILIZER KNEE 22 UNIV (SOFTGOODS) ×3 IMPLANT
IMMOBILIZER KNEE 24 THIGH 36 (MISCELLANEOUS) IMPLANT
IMMOBILIZER KNEE 24 UNIV (MISCELLANEOUS)
KIT BASIN OR (CUSTOM PROCEDURE TRAY) ×3 IMPLANT
KIT ROOM TURNOVER OR (KITS) ×3 IMPLANT
KNEE/VIT E POLY LINER LEVEL 1B ×3 IMPLANT
MANIFOLD NEPTUNE II (INSTRUMENTS) ×3 IMPLANT
NEEDLE 18GX1X1/2 (RX/OR ONLY) (NEEDLE) ×3 IMPLANT
NEEDLE 25GX 5/8IN NON SAFETY (NEEDLE) ×3 IMPLANT
NS IRRIG 1000ML POUR BTL (IV SOLUTION) ×3 IMPLANT
PACK TOTAL JOINT (CUSTOM PROCEDURE TRAY) ×3 IMPLANT
PAD ARMBOARD 7.5X6 YLW CONV (MISCELLANEOUS) ×6 IMPLANT
PAD CAST 4YDX4 CTTN HI CHSV (CAST SUPPLIES) ×1 IMPLANT
PADDING CAST COTTON 4X4 STRL (CAST SUPPLIES) ×2
PADDING CAST COTTON 6X4 STRL (CAST SUPPLIES) ×3 IMPLANT
SET HNDPC FAN SPRY TIP SCT (DISPOSABLE) ×1 IMPLANT
STRIP CLOSURE SKIN 1/2X4 (GAUZE/BANDAGES/DRESSINGS) ×2 IMPLANT
SUCTION FRAZIER TIP 10 FR DISP (SUCTIONS) ×3 IMPLANT
SUT MNCRL AB 4-0 PS2 18 (SUTURE) ×3 IMPLANT
SUT VIC AB 0 CT1 27 (SUTURE) ×4
SUT VIC AB 0 CT1 27XBRD ANBCTR (SUTURE) ×2 IMPLANT
SUT VIC AB 1 CT1 27 (SUTURE) ×4
SUT VIC AB 1 CT1 27XBRD ANBCTR (SUTURE) ×2 IMPLANT
SUT VIC AB 2-0 CT1 27 (SUTURE) ×4
SUT VIC AB 2-0 CT1 TAPERPNT 27 (SUTURE) ×2 IMPLANT
SYR 50ML LL SCALE MARK (SYRINGE) ×3 IMPLANT
SYR CONTROL 10ML LL (SYRINGE) ×3 IMPLANT
TOWEL OR 17X24 6PK STRL BLUE (TOWEL DISPOSABLE) ×3 IMPLANT
TOWEL OR 17X26 10 PK STRL BLUE (TOWEL DISPOSABLE) ×3 IMPLANT
WATER STERILE IRR 1000ML POUR (IV SOLUTION) IMPLANT

## 2014-09-04 NOTE — H&P (View-Only) (Signed)
TOTAL KNEE ADMISSION H&P  Patient is being admitted for left total knee arthroplasty.  Subjective:  Chief Complaint:left knee pain.  HPI: Robin [name], 47 y.o. female, has a history of pain and functional disability in the left knee due to arthritis and has failed non-surgical conservative treatments for greater than 12 weeks to includeNSAID's and/or analgesics, corticosteriod injections, viscosupplementation injections, use of assistive devices and activity modification.  Onset of symptoms was gradual, starting 8 years ago with rapidlly worsening course since that time. The patient noted no past surgery on the left knee(s).  Patient currently rates pain in the left knee(s) at 9 out of 10 with activity. Patient has night pain, worsening of pain with activity and weight bearing, pain that interferes with activities of daily living, pain with passive range of motion, crepitus and joint swelling.  Patient has evidence of subchondral cysts, subchondral sclerosis and joint space narrowing by imaging studies. There is no active infection.  Patient Active Problem List   Diagnosis Date Noted  . DJD (degenerative joint disease) of knee 07/03/2014   Past Medical History  Diagnosis Date  . Hypertension   . Diabetes mellitus   . GERD (gastroesophageal reflux disease)   . Obesity   . Headache(784.0)     hx migraine  . Arthritis     Past Surgical History  Procedure Laterality Date  . Cholecystectomy    . Cesarean section    . Tubal ligation  1994  . Eye surgery Bilateral     cataracts  . Total knee arthroplasty Right 07/03/2014    Procedure: TOTAL KNEE ARTHROPLASTY;  Surgeon: Daniel F Murphy, MD;  Location: [facility] OR;  Service: Orthopedics;  Laterality: Right;     (Not in a hospital admission) Allergies  Allergen Reactions  . Sulfa Antibiotics Hives and Itching    History  Substance Use Topics  . Smoking status: Former Smoker -- 0.50 packs/day for 9 years    Quit date: 12/27/2009  .  Smokeless tobacco: Never Used  . Alcohol Use: No    No family history on file.   Review of Systems  Constitutional: Negative.   HENT: Negative.   Eyes: Negative.   Respiratory: Negative.   Cardiovascular: Negative.   Gastrointestinal: Negative.   Genitourinary: Negative.   Musculoskeletal: Positive for joint pain.  Skin: Negative.   Neurological: Negative.   Psychiatric/Behavioral: Negative.     Objective:  Physical Exam  Constitutional: She is oriented to person, place, and time. She appears well-developed and well-nourished.  HENT:  Head: Normocephalic and atraumatic.  Eyes: EOM are normal. Pupils are equal, round, and reactive to light.  Neck: Normal range of motion. Neck supple.  Cardiovascular: Normal rate, regular rhythm and normal heart sounds.  Exam reveals no gallop and no friction rub.   No murmur heard. Respiratory: Effort normal and breath sounds normal. No respiratory distress. She has no wheezes. She has no rales.  GI: Soft. Bowel sounds are normal.  Musculoskeletal:  She comes today with crutches. Examination of her left knee reveals range of motion from about 0 to 95 degrees. Medial joint line tenderness to palpation.  Ligaments are stable.  Minimal patellofemoral crepitus.  She does have 5 degrees of varus and 5 degrees of flexion contracture as well.  Negative log roll.  Negative straight leg raise.  She is neurovascularly intact distally.    Neurological: She is alert and oriented to person, place, and time.  Skin: Skin is warm and dry.  Psychiatric: She has   a normal mood and affect. Her behavior is normal. Judgment and thought content normal.    Vital signs in last 24 hours: @  Labs:   Estimated body mass index is 37.28 kg/(m^2) as calculated from the following:   Height as of 07/04/14:  (1.651 m).   Weight as of 06/26/14: 101.606 kg (224 lb).   Imaging Review Plain radiographs demonstrate severe degenerative joint disease of the left  knee(s). The overall alignment ismild varus. The bone quality appears to be fair for age and reported activity level.  Assessment/Plan:  End stage arthritis, left knee   The patient history, physical examination, clinical judgment of the provider and imaging studies are consistent with end stage degenerative joint disease of the left knee(s) and total knee arthroplasty is deemed medically necessary. The treatment options including medical management, injection therapy arthroscopy and arthroplasty were discussed at length. The risks and benefits of total knee arthroplasty were presented and reviewed. The risks due to aseptic loosening, infection, stiffness, patella tracking problems, thromboembolic complications and other imponderables were discussed. The patient acknowledged the explanation, agreed to proceed with the plan and consent was signed. Patient is being admitted for inpatient treatment for surgery, pain control, PT, OT, prophylactic antibiotics, VTE prophylaxis, progressive ambulation and ADL's and discharge planning. The patient is planning to be discharged home with home health services

## 2014-09-04 NOTE — Progress Notes (Signed)
Orthopedic Tech Progress Note Patient Details:  Robin [name] 12-Jun-1966 [id number]  CPM Left Knee CPM Left Knee: On Left Knee Flexion (Degrees): 90 Left Knee Extension (Degrees): 0 Additional Comments: Trapeze bar   Cammer, Mickie Bail 09/04/2014, 1:53 PM

## 2014-09-04 NOTE — Discharge Instructions (Signed)
Total Knee Replacement, Care After °Refer to this sheet in the next few weeks. These instructions provide you with information on caring for yourself after your procedure. Your health care provider also may give you specific instructions. Your treatment has been planned according to the most current medical practices, but problems sometimes occur. Call your health care provider if you have any problems or questions after your procedure. °HOME CARE INSTRUCTIONS  ° °Weight bearing as tolerated.  Take Aspirin 1 tab a day for the next 30 days to prevent blood clots.  Change dressing daily as needed starting on Saturday.  May shower on Monday, but do not soak incision.  May apply ice for up to 20 minutes at a time for pain and swelling.  Follow up appointment in one week.  ° °· See a physical therapist as directed by your health care provider. °· Take medicines only as directed by your health care provider. °· Avoid lifting or driving until you are instructed otherwise. °· If you have been sent home with a continuous passive motion machine, use it as directed by your health care provider. °SEEK MEDICAL CARE IF: °· You have difficulty breathing. °· You have drainage, redness, swelling, or pain at your incision site. °· You have a bad smell coming from your incision site. °· You have persistent bleeding from your incision site. °· Your incision breaks open after sutures (stitches) or staples have been removed. °· You have a fever. °SEEK IMMEDIATE MEDICAL CARE IF:  °· You have a rash. °· You have pain or swelling in your calf or thigh. °· You have shortness of breath or chest pain. °· Your range of motion in your knee is decreasing rather than increasing. °MAKE SURE YOU:  °· Understand these instructions. °· Will watch your condition. °· Will get help right away if you are not doing well or get worse. °Document Released: 07/02/2005 Document Revised: 04/29/2014 Document Reviewed: 02/01/2012 °Exit[employer]® Patient Information  ©2015 [employer]. This information is not intended to replace advice given to you by your health care provider. Make sure you discuss any questions you have with your health care provider. ° °

## 2014-09-04 NOTE — Discharge Summary (Addendum)
Patient ID: Robin [name] MRN: [id number] DOB/AGE: [birth year] 49 y.o.  Admit date: 09/04/2014 Discharge date: 09/06/2014  Admission Diagnoses:  Active Problems:   DJD (degenerative joint disease) of knee   Discharge Diagnoses:  Same  Past Medical History  Diagnosis Date  . Hypertension   . Diabetes mellitus   . GERD (gastroesophageal reflux disease)   . Obesity   . Headache(784.0)     hx migraine  . Arthritis     Surgeries: Procedure(s): LEFT TOTAL KNEE ARTHROPLASTY on 09/04/2014   Consultants:    Discharged Condition: Improved  Hospital Course: Robin [name] is an 48 y.o. female who was admitted 09/04/2014 for operative treatment of left knee osteoarthritis. Patient has severe unremitting pain that affects sleep, daily activities, and work/hobbies. After pre-op clearance the patient was taken to the operating room on 09/04/2014 and underwent  Procedure(s): LEFT TOTAL KNEE ARTHROPLASTY.  Patient developed ABLA pod#1.  She is asymptomatic but we will continue to follow.  Patient was given perioperative antibiotics:     Anti-infectives   Start     Dose/Rate Route Frequency Ordered Stop   09/04/14 1700  ceFAZolin (ANCEF) IVPB 2 g/50 mL premix     2 g 100 mL/hr over 30 Minutes Intravenous Every 6 hours 09/04/14 1528 09/04/14 2241   09/04/14 0600  ceFAZolin (ANCEF) IVPB 2 g/50 mL premix     2 g 100 mL/hr over 30 Minutes Intravenous On call to O.R. 09/03/14 1405 09/04/14 1052       Patient was given sequential compression devices, early ambulation, and chemoprophylaxis to prevent DVT.  Patient benefited maximally from hospital stay and there were no complications.    Recent vital signs:  Patient Vitals for the past 24 hrs:  BP Temp Temp src Pulse Resp SpO2  09/06/14 0600 118/72 mmHg 101.9 F (38.8 C) - 125 16 95 %  09/06/14 0000 - - - - 16 97 %  09/05/14 2045 128/79 mmHg 101.4 F (38.6 C) - - 16 97 %  09/05/14 2000 - - - - 16 97 %  09/05/14 1259 149/85 mmHg 98.5 F  (36.9 C) - 110 18 96 %  09/05/14 0848 120/75 mmHg 98.4 F (36.9 C) Oral 78 16 92 %  09/05/14 0800 - - - - 16 92 %     Recent laboratory studies:   Recent Labs  09/05/14 0645 09/06/14 0555  WBC 9.0 10.6*  HGB 8.5* 8.2*  HCT 28.1* 27.8*  PLT 198 192  NA 137  --   K 4.6  --   CL 98  --   CO2 26  --   BUN 8  --   CREATININE 0.66  --   GLUCOSE 170*  --   CALCIUM 8.6  --      Discharge Medications:     Medication List    STOP taking these medications       oxyCODONE-acetaminophen 5-325 MG per tablet  Commonly known as:  PERCOCET/ROXICET      TAKE these medications       albuterol 108 (90 BASE) MCG/ACT inhaler  Commonly known as:  PROVENTIL HFA;VENTOLIN HFA  Inhale 2 puffs into the lungs every 6 (six) hours as needed for shortness of breath.     aspirin EC 325 MG tablet  Take 1 tablet (325 mg total) by mouth daily.     bisacodyl 5 MG EC tablet  Commonly known as:  DULCOLAX  Take 1 tablet (5 mg total) by mouth daily as  needed for moderate constipation.     HYDROmorphone 2 MG tablet  Commonly known as:  DILAUDID  Take 1 tablet (2 mg total) by mouth every 4 (four) hours as needed for severe pain.     insulin glargine 100 UNIT/ML injection  Commonly known as:  LANTUS  Inject 50 Units into the skin 2 (two) times daily as needed (low blood sugar).     lisinopril-hydrochlorothiazide 20-12.5 MG per tablet  Commonly known as:  PRINZIDE,ZESTORETIC  Take 1 tablet by mouth daily.     methocarbamol 500 MG tablet  Commonly known as:  ROBAXIN  Take 1 tablet (500 mg total) by mouth 4 (four) times daily.     omeprazole 10 MG capsule  Commonly known as:  PRILOSEC  Take 10 mg by mouth daily.     ondansetron 4 MG tablet  Commonly known as:  ZOFRAN  Take 1 tablet (4 mg total) by mouth every 8 (eight) hours as needed for nausea or vomiting.        Diagnostic Studies: Dg Knee Left Port  09/04/2014   CLINICAL DATA:  48 year old female status post surgery. Initial  encounter.  EXAM: PORTABLE LEFT KNEE - 1-2 VIEW  COMPARISON:  06/04/2014.  FINDINGS: Supine AP and cross-table lateral views of the left knee. Total knee arthroplasty hardware in place, appears intact and normally aligned. Postoperative changes to the surrounding soft tissues with postoperative drain in place. No unexpected osseous changes identified.  IMPRESSION: Left total knee arthroplasty with no adverse features.   Electronically Signed   By: Augusto Gamble M.D.   On: 09/04/2014 13:21    Disposition: 06-Home-Health Care Svc  Discharge Instructions   CPM    Complete by:  As directed   Continuous passive motion machine (CPM):      Use the CPM from 0- to 60 for 6 hours per day.      You may increase by 10 per day.  You may break it up into 2 or 3 sessions per day.      Use CPM for 2-3 weeks or until you are told to stop.     Call MD / Call 911    Complete by:  As directed   If you experience chest pain or shortness of breath, CALL 911 and be transported to the hospital emergency room.  If you develope a fever above 101 F, pus (white drainage) or increased drainage or redness at the wound, or calf pain, call your surgeon's office.     Change dressing    Complete by:  As directed   Change dressing on Saturday, then change the dressing daily with sterile 4 x 4 inch gauze dressing and apply TED hose.  You may clean the incision with alcohol prior to redressing.     Constipation Prevention    Complete by:  As directed   Drink plenty of fluids.  Prune juice may be helpful.  You may use a stool softener, such as Colace (over the counter) 100 mg twice a day.  Use MiraLax (over the counter) for constipation as needed.     Diet - low sodium heart healthy    Complete by:  As directed      Discharge instructions    Complete by:  As directed   Weight bearing as tolerated.  Take Aspirin 1 tab a day for the next 30 days to prevent blood clots.  Change dressing daily as needed starting on Saturday.  May shower  on Monday,  but do not soak incision.  May apply ice for up to 20 minutes at a time for pain and swelling.  Follow up appointment in one week.     Do not put a pillow under the knee. Place it under the heel.    Complete by:  As directed   Place gray foam under operative heel when in bed or in a chair to work on extension     Increase activity slowly as tolerated    Complete by:  As directed      TED hose    Complete by:  As directed   Use stockings (TED hose) for 2 weeks on both leg(s).  You may remove them at night for sleeping.           Follow-up Information   Follow up with [facility], MD. Schedule an appointment as soon as possible for a visit in 2 weeks.   Specialty:  Orthopedic Surgery   Contact information:   [address] [place] [address] [phone]       Follow up with [facility]. (Someone from [facility] will contact you concerning start date and time for physical therapy.)    Contact information:   [address] [place] [address] [phone]        Signed: Gearldine Shown 09/06/2014, 7:11 AM   '

## 2014-09-04 NOTE — Progress Notes (Signed)
Utilization review completed.  

## 2014-09-04 NOTE — Progress Notes (Signed)
Orthopedic Tech Progress Note Patient Details:  Robin [name] 1966/01/29 [id number] On cpm LLE 0-80 at 7:35 pm Patient ID: Robin [name], female   DOB: Dec 30, 1965, 48 y.o.   MRN: [id number]   Robin [name] 09/04/2014, 7:37 PM

## 2014-09-04 NOTE — Op Note (Signed)
NAMEMarland Kitchen  [name], Robin [name] NO.:  [id number]  MEDICAL RECORD NO.:  [id number]  LOCATION:  5N03C                        FACILITY:  [facility]  PHYSICIAN:  Loreta Ave, M.D. DATE OF BIRTH:  1966/09/05  DATE OF PROCEDURE:  09/04/2014 DATE OF DISCHARGE:                              OPERATIVE REPORT   PREOPERATIVE DIAGNOSIS: 1. Left knee end-stage degenerative arthritis. 2. 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  POSTOPERATIVE DIAGNOSIS: 1. Left knee end-stage degenerative arthritis. 2. 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  PROCEDURE: 1. Left knee modified minimally invasive total knee replacement     Stryker triathlon prosthesis.  Soft tissue balancing.  A cemented     pegged #3 CR femoral component.  Cemented #4 tibial component with     a 9 mm CS insert.  Cemented resurfacing 35-mm patellar component. 2. Right knee exam manipulation under anesthesia.  SURGEON:  Loreta Ave, M.D.  ASSISTANT:  Rayfield Citizen, PA, who was present throughout the entire case and necessary for timely completion of procedure.  ANESTHESIA:  General.  ESTIMATED BLOOD LOSS:  Minimal.  SPECIMENS:  None.  CULTURES:  None.  COMPLICATIONS:  None.  DRESSINGS:  Soft compressive knee immobilizer.  DRAINS:  Hemovac x1.  TOURNIQUET TIME:  45 minutes.  PROCEDURE IN DETAIL:  The patient was brought to the operating room, placed on the operating table in supine position.  After adequate anesthesia had been obtained, right knee examined.  Motion 5 to about 90 degrees.  Her early adhesions.  Gently manipulated, achieving full extension 125 degrees of flexion.  Good stability.  Attention was turned to the left.  Tourniquet applied.  Prepped and draped in usual sterile fashion.  Exsanguinated with elevation of Esmarch.  Tourniquet inflated to 350 mmHg.  Straight incision above the patella down the  tibial tubercle.  Skin and subcutaneous tissue divided.  Medial arthrotomy, vastus splitting, preserving quad tendon.  Distal femur exposed. Flexible intramedullary guide.  A 8 mm resection 5 degrees of valgus. Using epicondylar axis, the femur was sized, cut, and fitted for a pegged CR #3 component.  Proximal tibial resection extramedullary guide. Size #4 component.  Patella exposed.  Posterior 10 mm removed.  Drilled, sized, and fitted for a 35 mm component.  Trials put in place with 9 mm insert and medial capsule release.  I was very pleased about mechanical axis.  Balance in flexion, extension, stability, and patellar tracking. Tibia was marked for rotation and hand reamed.  All trials removed. Copious irrigation with a pulse irrigating device.  Cement prepared, placed on all components, firmly seated.  Polyethylene attached to tibia, knee reduced.  Patella with a clamp.  Once cement hardened, the knee was irrigated again.  Soft tissue was injected with Exparel. Hemovac was placed and brought through a separate stab wound. Arthrotomy closed with #1 Vicryl with subcutaneous subcuticular closure above that.  Sterile compressive dressing applied.  Tourniquet deflated and removed.  Knee immobilizer applied.  Anesthesia reversed.  Brought to the recovery room.  Tolerated the surgery well.  No complications.  Loreta Ave, M.D.     DFM/MEDQ  D:  09/04/2014  T:  09/04/2014  Job:  [id number]

## 2014-09-04 NOTE — Evaluation (Signed)
Physical Therapy Evaluation Patient Details Name: Robin [name] MRN: [id number] DOB: 03-28-1966 Today's Date: 09/04/2014   History of Present Illness  Patient is a 48 y/o female s/p left TKA 9/9. Hx of HTN, DM, GERD and obesity. Had R TKA in 06/2014. WBAT LLE.  Clinical Impression  Patient is s/p left TKA and presents with post surgical deficits and pain limiting safe mobility. Pt highly motivated and cooperative with therapy. Will need to assess ability to negotiate 16 steps prior to discharge as pt determined to return home with her children. Pt tolerated short distance ambulation today without difficulty. Pt would benefit from skilled acute PT and follow up HHPT to improve transfers, gait, balance and overall safe mobility so pt can maximize independence and return to PLOF.    Follow Up Recommendations Home health PT;Supervision/Assistance - 24 hour    Equipment Recommendations  None recommended by PT    Recommendations for Other Services       Precautions / Restrictions Precautions Precautions: Knee;Fall Precaution Booklet Issued: No Restrictions Weight Bearing Restrictions: Yes RLE Weight Bearing: Weight bearing as tolerated      Mobility  Bed Mobility Overal bed mobility: Modified Independent;Needs Assistance Bed Mobility: Supine to Sit;Sit to Supine     Supine to sit: Supervision Sit to supine: Supervision   General bed mobility comments: HOB elevated and use of rail for support. Increased time. Supervision for safety.  Transfers Overall transfer level: Needs assistance Equipment used: Rolling walker (2 wheeled) Transfers: Sit to/from Stand Sit to Stand: Min guard         General transfer comment: Min guard for safety due to first time pt being up and pain in left knee.  Ambulation/Gait Ambulation/Gait assistance: Min guard Ambulation Distance (Feet): 25 Feet Assistive device: Rolling walker (2 wheeled) Gait Pattern/deviations: Step-to pattern;Decreased  stance time - left;Decreased step length - right;Trunk flexed Gait velocity: Decreased   General Gait Details: Pt with step to gait pattern secondary to pain through LLE with weight bearing. Increased knee flexion throughout gait cycle.  Stairs            Wheelchair Mobility    Modified Rankin (Stroke Patients Only)       Balance Overall balance assessment: Needs assistance Sitting-balance support: Feet supported;No upper extremity supported Sitting balance-Leahy Scale: Good       Standing balance-Leahy Scale: Poor Standing balance comment: Requires UE support during dynamic standing due to pain and weakness s/p Lft TKA.                             Pertinent Vitals/Pain Pain Assessment: 0-10 Pain Score: 9  Pain Location: left knee Pain Descriptors / Indicators: Sore;Aching;Grimacing Pain Intervention(s): Limited activity within patient's tolerance;Monitored during session;Premedicated before session;Repositioned    Home Living Family/patient expects to be discharged to:: Private residence Living Arrangements: Children Available Help at Discharge: Family;Available 24 hours/day Type of Home: Apartment Home Access: Stairs to enter Entrance Stairs-Rails: Right;Left Entrance Stairs-Number of Steps: 16 Home Layout: One level Home Equipment: Walker - 2 wheels;Bedside commode      Prior Function Level of Independence: Independent with assistive device(s)         Comments: Used RW for ambulation s/p R TKA in July 2015.  Reports getting OPPT for right knee.      Hand Dominance   Dominant Hand: Right    Extremity/Trunk Assessment   Upper Extremity Assessment: Defer to OT evaluation  Lower Extremity Assessment: RLE deficits/detail;LLE deficits/detail;Generalized weakness RLE Deficits / Details: Lacking full knee extension and decreased knee flexion from prior R TKA in July 2015.  LLE Deficits / Details: Decreased knee  extension/flexion, and hip flexion secondary to pain and being post surgical.      Communication   Communication: No difficulties  Cognition Arousal/Alertness: Awake/alert Behavior During Therapy: WFL for tasks assessed/performed Overall Cognitive Status: Within Functional Limits for tasks assessed                      General Comments General comments (skin integrity, edema, etc.): Hemovac intact. Surgical site not visible due to ace wrap.    Exercises Total Joint Exercises Ankle Circles/Pumps: Both;10 reps;Supine Quad Sets: Both;10 reps;Supine Gluteal Sets: Both;10 reps;Supine Straight Leg Raises: AROM;Left;Supine (Able to perform SLR with extension lag.)      Assessment/Plan    PT Assessment Patient needs continued PT services  PT Diagnosis Abnormality of gait;Generalized weakness;Acute pain   PT Problem List Decreased strength;Pain;Decreased range of motion;Decreased activity tolerance;Decreased knowledge of use of DME;Decreased balance;Decreased mobility;Decreased skin integrity  PT Treatment Interventions DME instruction;Balance training;Gait training;Stair training;Patient/family education;Functional mobility training;Therapeutic activities;Therapeutic exercise   PT Goals (Current goals can be found in the Care Plan section) Acute Rehab PT Goals Patient Stated Goal: to go home PT Goal Formulation: With patient Time For Goal Achievement: 09/18/14 Potential to Achieve Goals: Good    Frequency 7X/week   Barriers to discharge Inaccessible home environment Pt has 16 steps to climb to get into son's apt.    Co-evaluation               End of Session Equipment Utilized During Treatment: Gait belt Activity Tolerance: Patient tolerated treatment well Patient left: in bed;with call bell/phone within reach;with bed alarm set Nurse Communication: Mobility status;Precautions         Time: 7846-9629 PT Time Calculation (min): 27 min   Charges:   PT  Evaluation $Initial PT Evaluation Tier I: 1 Procedure PT Treatments $Gait Training: 8-22 mins   PT G CodesAlvie Heidelberg A 09/04/2014, 5:22 PM Alvie Heidelberg, PT, DPT [phone]

## 2014-09-04 NOTE — Interval H&P Note (Signed)
History and Physical Interval Note:  09/04/2014 8:17 AM  Robin [name]  has presented today for surgery, with the diagnosis of djd left knee  The various methods of treatment have been discussed with the patient and family. After consideration of risks, benefits and other options for treatment, the patient has consented to  Procedure(s): LEFT TOTAL KNEE ARTHROPLASTY (Left) as a surgical intervention .  The patient's history has been reviewed, patient examined, no change in status, stable for surgery.  I have reviewed the patient's chart and labs.  Questions were answered to the patient's satisfaction.     Jacquel Mccamish F

## 2014-09-04 NOTE — Transfer of Care (Signed)
Immediate Anesthesia Transfer of Care Note  Patient: Robin [name]  Procedure(s) Performed: Procedure(s): LEFT TOTAL KNEE ARTHROPLASTY (Left)  Patient Location: PACU  Anesthesia Type:General  Level of Consciousness: awake, sedated and patient cooperative  Airway & Oxygen Therapy: Patient Spontanous Breathing and Patient connected to nasal cannula oxygen  Post-op Assessment: Report given to PACU RN, Post -op Vital signs reviewed and stable and Patient moving all extremities  Post vital signs: Reviewed and stable  Complications: No apparent anesthesia complications

## 2014-09-04 NOTE — Anesthesia Preprocedure Evaluation (Addendum)
Anesthesia Evaluation  Patient identified by MRN, date of birth, ID band Patient awake    Reviewed: Allergy & Precautions, H&P , NPO status , Patient's Chart, lab work & pertinent test results  History of Anesthesia Complications Negative for: history of anesthetic complications  Airway Mallampati: II TM Distance: >3 FB Neck ROM: Full    Dental  (+) Teeth Intact, Dental Advisory Given   Pulmonary neg sleep apnea, neg COPDformer smoker,  breath sounds clear to auscultation        Cardiovascular hypertension, Pt. on medications - angina- Past MI and - CHF - dysrhythmias Rhythm:Regular     Neuro/Psych negative neurological ROS  negative psych ROS   GI/Hepatic Neg liver ROS, GERD-  Medicated and Controlled,  Endo/Other  diabetes, Well Controlled, Type 2, Insulin DependentMorbid obesity  Renal/GU negative Renal ROS     Musculoskeletal  (+) Arthritis -, Osteoarthritis,    Abdominal   Peds  Hematology negative hematology ROS (+)   Anesthesia Other Findings   Reproductive/Obstetrics                          Anesthesia Physical Anesthesia Plan  ASA: II  Anesthesia Plan: General   Post-op Pain Management:    Induction: Intravenous  Airway Management Planned: Oral ETT  Additional Equipment: None  Intra-op Plan:   Post-operative Plan: Extubation in OR  Informed Consent: I have reviewed the patients History and Physical, chart, labs and discussed the procedure including the risks, benefits and alternatives for the proposed anesthesia with the patient or authorized representative who has indicated his/her understanding and acceptance.   Dental advisory given  Plan Discussed with: CRNA and Surgeon  Anesthesia Plan Comments:         Anesthesia Quick Evaluation

## 2014-09-04 NOTE — Plan of Care (Signed)
Problem: Consults Goal: Diagnosis- Total Joint Replacement Primary Total Knee Left     

## 2014-09-04 NOTE — Anesthesia Postprocedure Evaluation (Signed)
  Anesthesia Post-op Note  Patient: Robin [name]  Procedure(s) Performed: Procedure(s): LEFT TOTAL KNEE ARTHROPLASTY (Left)  Patient Location: PACU  Anesthesia Type:General  Level of Consciousness: awake and alert   Airway and Oxygen Therapy: Patient Spontanous Breathing  Post-op Pain: moderate  Post-op Assessment: Post-op Vital signs reviewed, Patient's Cardiovascular Status Stable, Respiratory Function Stable, Patent Airway, NAUSEA AND VOMITING PRESENT and Pain level controlled  Post-op Vital Signs: Reviewed and stable  Last Vitals:  Filed Vitals:   09/04/14 1434  BP:   Pulse:   Temp: 36.9 C  Resp:     Complications: No apparent anesthesia complications

## 2014-09-05 ENCOUNTER — Encounter (HOSPITAL_COMMUNITY): Payer: Self-pay | Admitting: General Practice

## 2014-09-05 LAB — GLUCOSE, CAPILLARY
GLUCOSE-CAPILLARY: 174 mg/dL — AB (ref 70–99)
Glucose-Capillary: 158 mg/dL — ABNORMAL HIGH (ref 70–99)
Glucose-Capillary: 168 mg/dL — ABNORMAL HIGH (ref 70–99)
Glucose-Capillary: 166 mg/dL — ABNORMAL HIGH (ref 70–99)

## 2014-09-05 LAB — BASIC METABOLIC PANEL
ANION GAP: 13 (ref 5–15)
BUN: 8 mg/dL (ref 6–23)
CALCIUM: 8.6 mg/dL (ref 8.4–10.5)
CHLORIDE: 98 meq/L (ref 96–112)
CO2: 26 meq/L (ref 19–32)
Creatinine, Ser: 0.66 mg/dL (ref 0.50–1.10)
GFR calc non Af Amer: >90 mL/min (ref >90)
Glucose, Bld: 170 mg/dL — ABNORMAL HIGH (ref 70–99)
Potassium: 4.6 mEq/L (ref 3.7–5.3)
SODIUM: 137 meq/L (ref 137–147)

## 2014-09-05 LAB — CBC
HCT: 28.1 % — ABNORMAL LOW (ref 36.0–46.0)
Hemoglobin: 8.5 g/dL — ABNORMAL LOW (ref 12.0–15.0)
MCH: 24.4 pg — AB (ref 26.0–34.0)
MCHC: 30.2 g/dL (ref 30.0–36.0)
MCV: 80.7 fL (ref 78.0–100.0)
PLATELETS: 198 10*3/uL (ref 150–400)
RBC: 3.48 MIL/uL — ABNORMAL LOW (ref 3.87–5.11)
RDW: 16.9 % — ABNORMAL HIGH (ref 11.5–15.5)
WBC: 9 10*3/uL (ref 4.0–10.5)

## 2014-09-05 NOTE — Progress Notes (Signed)
Orthopedic Tech Progress Note Patient Details:  Robin [name] 03/03/1966 [id number]  Ortho Devices Ortho Device/Splint Location: foot roll Ortho Device/Splint Interventions: Application   Cammer, Mickie Bail 09/05/2014, 4:38 PM

## 2014-09-05 NOTE — Care Management Note (Signed)
CARE MANAGEMENT NOTE 09/05/2014  Patient:  Robin [name], Robin [name]   Account Number:  [id number]  Date Initiated:  09/05/2014  Documentation initiated by:  Vance Peper  Subjective/Objective Assessment:   48 yr old female admitted with DJD of left knee, s/p left total knee arthroplasty.     Action/Plan:   Case manager spoke with patient concerning home health and DME needs at discharge. Patient preoperatively setup with [facility], no changes. Patient has family support at discharge.   Anticipated DC Date:  09/06/2014   Anticipated DC Plan:  HOME W HOME HEALTH SERVICES      DC Planning Services  CM consult      [facility] Choice  HOME HEALTH   Choice offered to / List presented to:  C-1 Patient   DME arranged  [facility]  [facility]  3-N-1      DME agency  [employer]     HH arranged  HH-2 PT      HH agency  [facility]   Status of service:  Completed, signed off Medicare Important Message given?   (If response is "NO", the following Medicare IM given date fields will be blank) Date Medicare IM given:   Medicare IM given by:   Date Additional Medicare IM given:   Additional Medicare IM given by:    Discharge Disposition:  HOME W HOME HEALTH SERVICES  Per UR Regulation:  Reviewed for med. necessity/level of care/duration of stay

## 2014-09-05 NOTE — Progress Notes (Signed)
Physical Therapy Treatment Patient Details Name: Robin [name] MRN: [id number] DOB: [birth year] Today's Date: 09/05/2014    History of Present Illness Patient is a 48 y/o female s/p left TKA 9/9. Hx of HTN, DM, GERD and obesity. Had R TKA in 06/2014. WBAT LLE.    PT Comments    Upon entering room patient attempting to position herself comfortable in bed by lying on her side. Explained to patient that this was not the best position for her knee at this time. Patient agreeable to get OOB to recliner in attempt to get more comfortable. Patient in increased pain today compared to yesterday. She was able to ambulate some to [facility] but refused further ambulation due to pain. Will attempt long ambulation this afternoon.   Follow Up Recommendations  Home health PT;Supervision/Assistance - 24 hour     Equipment Recommendations  None recommended by PT    Recommendations for Other Services       Precautions / Restrictions Precautions Precautions: Knee;Fall Restrictions Weight Bearing Restrictions: Yes RLE Weight Bearing: Weight bearing as tolerated    Mobility  Bed Mobility Overal bed mobility: Needs Assistance       Supine to sit: Min assist     General bed mobility comments: A for LLE out of bed. Cues for positioning.   Transfers Overall transfer level: Needs assistance Equipment used: Rolling walker (2 wheeled)   Sit to Stand: Min assist         General transfer comment: Min A to power up into standing from lower surface. MinGuard from [facility]. Cues for safe hand placement and to back up to recliner all the way prior to sitting  Ambulation/Gait Ambulation/Gait assistance: Min guard Ambulation Distance (Feet): 15 Feet Assistive device: Rolling walker (2 wheeled) Gait Pattern/deviations: Step-to pattern;Decreased stance time - left;Decreased step length - right     General Gait Details: Patient continues with pain through LLE. limited ambulation.    Stairs             Wheelchair Mobility    Modified Rankin (Stroke Patients Only)       Balance                                    Cognition Arousal/Alertness: Awake/alert Behavior During Therapy: WFL for tasks assessed/performed Overall Cognitive Status: Within Functional Limits for tasks assessed                      Exercises Total Joint Exercises Quad Sets: Both;10 reps;Supine Heel Slides: AAROM;Left;10 reps Hip ABduction/ADduction: AAROM;Left;10 reps    General Comments        Pertinent Vitals/Pain      Home Living                      Prior Function            PT Goals (current goals can now be found in the care plan section) Progress towards PT goals: Progressing toward goals    Frequency  7X/week    PT Plan Current plan remains appropriate    Co-evaluation             End of Session Equipment Utilized During Treatment: Gait belt Activity Tolerance: Patient limited by pain Patient left: in chair;with call bell/phone within reach     Time: 0754-0819 PT Time Calculation (min): 25 min  Charges:  $Gait Training: 8-22  mins $Therapeutic Exercise: 8-22 mins                    G Codes:      Fredrich Birks 09/05/2014, 8:55 AM 09/05/2014 Fredrich Birks PTA [phone] pager [phone] office

## 2014-09-05 NOTE — Progress Notes (Signed)
PT Cancellation Note  Patient Details Name: Robin [name] MRN: [id number] DOB: 07-19-1966   Cancelled Treatment:    Reason Eval/Treat Not Completed: Pain limiting ability to participate. Patient complaining of 10/10 pain. She stated MD coming to see her. Will follow up in AM   [name], [name] 09/05/2014, 1:51 PM

## 2014-09-05 NOTE — Progress Notes (Signed)
Orthopedic Tech Progress Note Patient Details:  Robin [name] [birth year] [id number] On cpm LLE at 8:10 pm decreased 0-40 per rn request will in crease as patient can tolerate rn to increase.  Patient ID: Robin [name], female   DOB: 10/30/1966, 48 y.o.   MRN: [id number]   Robin [name] 09/05/2014, 8:12 PM

## 2014-09-05 NOTE — Progress Notes (Signed)
Subjective: 1 Day Post-Op Procedure(s) (LRB): LEFT TOTAL KNEE ARTHROPLASTY (Left) Patient reports pain as 10 on 0-10 scale.  Patient is 2 months s/p right total knee replacement and has been taking 2 percocet q 4-6 hrs.  No nausea/vomiting, lightheadedness/dizziness.  Negative flatus or bm.  Tolerating diet.  Objective: Vital signs in last 24 hours: Temp:  [97.7 F (36.5 C)-99.6 F (37.6 C)] 98.8 F (37.1 C) (09/10 0551) Pulse Rate:  [71-122] 111 (09/10 0551) Resp:  [14-20] 16 (09/10 0551) BP: (122-181)/(76-105) 122/79 mmHg (09/10 0551) SpO2:  [95 %-100 %] 96 % (09/10 0551) Weight:  [99.338 kg (219 lb)] 99.338 kg (219 lb) (09/09 0931)  Intake/Output from previous day: 09/09 0701 - 09/10 0700 In: 1970 [P.O.:120; I.V.:1850] Out: 625 [Drains:600; Blood:25] Intake/Output this shift:    No results found for this basename: HGB,  in the last 72 hours No results found for this basename: WBC, RBC, HCT, PLT,  in the last 72 hours No results found for this basename: NA, K, CL, CO2, BUN, CREATININE, GLUCOSE, CALCIUM,  in the last 72 hours No results found for this basename: LABPT, INR,  in the last 72 hours  Neurologically intact Neurovascular intact Sensation intact distally Intact pulses distally Dorsiflexion/Plantar flexion intact Incision: scant drainage No cellulitis present Compartment soft hemovac drain pulled by me today Dressing changed by me today Negative homans bilaterally  Assessment/Plan: 1 Day Post-Op Procedure(s) (LRB): LEFT TOTAL KNEE ARTHROPLASTY (Left) Advance diet Up with therapy Plan for discharge tomorrow home with hhpt WBAT LLE  Robin [name], Robin [name] 09/05/2014, 7:06 AM

## 2014-09-06 LAB — CBC
HEMATOCRIT: 27.8 % — AB (ref 36.0–46.0)
Hemoglobin: 8.2 g/dL — ABNORMAL LOW (ref 12.0–15.0)
MCH: 23.5 pg — AB (ref 26.0–34.0)
MCHC: 29.5 g/dL — ABNORMAL LOW (ref 30.0–36.0)
MCV: 79.7 fL (ref 78.0–100.0)
PLATELETS: 192 10*3/uL (ref 150–400)
RBC: 3.49 MIL/uL — ABNORMAL LOW (ref 3.87–5.11)
RDW: 17 % — AB (ref 11.5–15.5)
WBC: 10.6 10*3/uL — ABNORMAL HIGH (ref 4.0–10.5)

## 2014-09-06 LAB — GLUCOSE, CAPILLARY
GLUCOSE-CAPILLARY: 192 mg/dL — AB (ref 70–99)
GLUCOSE-CAPILLARY: 174 mg/dL — AB (ref 70–99)
Glucose-Capillary: 225 mg/dL — ABNORMAL HIGH (ref 70–99)

## 2014-09-06 LAB — BASIC METABOLIC PANEL
ANION GAP: 14 (ref 5–15)
BUN: 8 mg/dL (ref 6–23)
CALCIUM: 8.7 mg/dL (ref 8.4–10.5)
CHLORIDE: 96 meq/L (ref 96–112)
CO2: 25 mEq/L (ref 19–32)
CREATININE: 0.69 mg/dL (ref 0.50–1.10)
GFR calc non Af Amer: >90 mL/min (ref >90)
Glucose, Bld: 205 mg/dL — ABNORMAL HIGH (ref 70–99)
Potassium: 4.6 mEq/L (ref 3.7–5.3)
Sodium: 135 mEq/L — ABNORMAL LOW (ref 137–147)

## 2014-09-06 MED ORDER — HYDROMORPHONE HCL 2 MG PO TABS
2.0000 mg | ORAL_TABLET | ORAL | Status: DC | PRN
  Administered 2014-09-06 (×2): 4 mg via ORAL
  Filled 2014-09-06 (×2): qty 2

## 2014-09-06 NOTE — Progress Notes (Signed)
Subjective: 2 Days Post-Op Procedure(s) (LRB): LEFT TOTAL KNEE ARTHROPLASTY (Left) Patient reports pain as 5 on 0-10 scale.  Patient reports mild nausea which has been relieved with antiemetic.  No vomiting.  No lightheadedness/dizziness.  Positive flatus but no bm as of yet.  Somewhat tolerating liquid diet.  Will try and advance diet today.    Objective: Vital signs in last 24 hours: Temp:  [98.4 F (36.9 C)-101.9 F (38.8 C)] 101.9 F (38.8 C) (09/11 0600) Pulse Rate:  [78-125] 125 (09/11 0600) Resp:  [16-18] 16 (09/11 0600) BP: (118-149)/(72-85) 118/72 mmHg (09/11 0600) SpO2:  [92 %-97 %] 95 % (09/11 0600)  Intake/Output from previous day: 09/10 0701 - 09/11 0700 In: 480 [P.O.:480] Out: -  Intake/Output this shift:     Recent Labs  09/05/14 0645 09/06/14 0555  HGB 8.5* 8.2*    Recent Labs  09/05/14 0645 09/06/14 0555  WBC 9.0 10.6*  RBC 3.48* 3.49*  HCT 28.1* 27.8*  PLT 198 192    Recent Labs  09/05/14 0645  NA 137  K 4.6  CL 98  CO2 26  BUN 8  CREATININE 0.66  GLUCOSE 170*  CALCIUM 8.6   No results found for this basename: LABPT, INR,  in the last 72 hours  Neurologically intact Neurovascular intact Sensation intact distally Intact pulses distally Dorsiflexion/Plantar flexion intact Compartment soft Negative homans bilaterally No drainage noted through dressing  Assessment/Plan: 2 Days Post-Op Procedure(s) (LRB): LEFT TOTAL KNEE ARTHROPLASTY (Left) Advance diet Up with therapy D/C IV fluids Discharge home with home health most likely today WBAT LLE ABLA- patient asymptomatic but will continue to follow  ANTON, M. LINDSEY 09/06/2014, 7:06 AM

## 2014-09-06 NOTE — Evaluation (Signed)
Occupational Therapy Evaluation Patient Details Name: Robin [name] MRN: [id number] DOB: [birth year] Today's Date: 09/06/2014    History of Present Illness Patient is a 48 y/o female s/p left TKA 9/9. Hx of HTN, DM, GERD and obesity. Had R TKA in 06/2014. WBAT LLE.   Clinical Impression   Pt. Is interested in AE for LE ADLs and is going to have family purchase. Pt. Requires further training on use of AE for LE ADLs. Pt. Is worried about climbing 16 steps to get into apartment. Pt. Is doing well with transfers to commode and is Min guard A currently. Pt. Would benefit from further OT prior to d/c home.     Follow Up Recommendations  Home health OT    Equipment Recommendations  None recommended by OT    Recommendations for Other Services       Precautions / Restrictions Precautions Precautions: Knee;Fall Restrictions RLE Weight Bearing: Weight bearing as tolerated      Mobility Bed Mobility                  Transfers Overall transfer level: Needs assistance     Sit to Stand: Min guard              Balance                                            ADL Overall ADL's : Needs assistance/impaired Eating/Feeding: Independent   Grooming: Wash/dry hands;Supervision/safety;Standing   Upper Body Bathing: Set up   Lower Body Bathing: Moderate assistance   Upper Body Dressing : Set up   Lower Body Dressing: Moderate assistance;Maximal assistance;With adaptive equipment   Toilet Transfer: Min guard;BSC           Functional mobility during ADLs: Min guard General ADL Comments:  (Pt. needs further training with AE)     Vision                     Perception     Praxis      Pertinent Vitals/Pain Pain Assessment: 0-10 Pain Score: 7  Pain Location:  (knee) Pain Descriptors / Indicators: Aching     Hand Dominance     Extremity/Trunk Assessment Upper Extremity Assessment Upper Extremity Assessment: Overall WFL for  tasks assessed           Communication Communication Communication: No difficulties   Cognition Arousal/Alertness: Awake/alert Behavior During Therapy: WFL for tasks assessed/performed Overall Cognitive Status: Within Functional Limits for tasks assessed                     General Comments       Exercises       Shoulder Instructions      Home Living Family/patient expects to be discharged to:: Private residence Living Arrangements: Children Available Help at Discharge: Family;Available 24 hours/day Type of Home: Apartment   Entrance Stairs-Number of Steps:  (16)         Bathroom Shower/Tub: Tub/shower unit;Curtain   Firefighter: Standard     Home Equipment: Environmental consultant - 2 wheels;Bedside commode          Prior Functioning/Environment Level of Independence: Independent with assistive device(s)             OT Diagnosis: Generalized weakness;Acute pain   OT Problem List: Decreased activity tolerance;Decreased knowledge of use of DME  or AE   OT Treatment/Interventions: Self-care/ADL training;DME and/or AE instruction;Therapeutic activities    OT Goals(Current goals can be found in the care plan section) Acute Rehab OT Goals Patient Stated Goal:  (go home) OT Goal Formulation: With patient Time For Goal Achievement: 09/20/14 Potential to Achieve Goals: Good ADL Goals Pt Will Perform Lower Body Bathing: with modified independence;sit to/from stand Pt Will Perform Lower Body Dressing: with modified independence;sit to/from stand Pt Will Transfer to Toilet: with modified independence;bedside commode Pt Will Perform Toileting - Clothing Manipulation and hygiene: with modified independence;sit to/from stand  OT Frequency: Min 2X/week   Barriers to D/C: Inaccessible home environment          Co-evaluation              End of Session    Activity Tolerance:   Patient left:     Time: 0818-0900 OT Time Calculation (min): 42  min Charges:  OT General Charges $OT Visit: 1 Procedure OT Evaluation $Initial OT Evaluation Tier I: 1 Procedure OT Treatments $Self Care/Home Management : 23-37 mins G-Codes:    Robin [name] 09/30/2014, 9:00 AM

## 2014-09-06 NOTE — Progress Notes (Signed)
Physical Therapy Treatment Patient Details Name: Robin [name] MRN: [id number] DOB: [birth year] Today's Date: 09/06/2014    History of Present Illness Patient is a 48 y/o female s/p left TKA 9/9. Hx of HTN, DM, GERD and obesity. Had R TKA in 06/2014. WBAT LLE.    PT Comments    Patient continues with increased pain this session but more motivated to walk. She really wants to go home today but has 16 steps to enter second floor apartment. Will attempt steps this afternoon  Follow Up Recommendations  Home health PT;Supervision/Assistance - 24 hour     Equipment Recommendations  None recommended by PT    Recommendations for Other Services       Precautions / Restrictions Precautions Precautions: Knee;Fall Restrictions Weight Bearing Restrictions: Yes RLE Weight Bearing: Weight bearing as tolerated    Mobility  Bed Mobility Overal bed mobility: Needs Assistance Bed Mobility: Supine to Sit;Sit to Supine     Supine to sit: Supervision     General bed mobility comments:  Cues for positioning.   Transfers Overall transfer level: Needs assistance Equipment used: Rolling walker (2 wheeled) Transfers: Sit to/from Stand Sit to Stand: Supervision         General transfer comment: Supervision for safety  Ambulation/Gait Ambulation/Gait assistance: Supervision Ambulation Distance (Feet): 90 Feet Assistive device: Rolling walker (2 wheeled) Gait Pattern/deviations: Step-to pattern;Decreased stance time - left;Decreased step length - right     General Gait Details: Cues for upright posture and safe positioning of RW.    Stairs            Wheelchair Mobility    Modified Rankin (Stroke Patients Only)       Balance                                    Cognition Arousal/Alertness: Awake/alert Behavior During Therapy: WFL for tasks assessed/performed Overall Cognitive Status: Within Functional Limits for tasks assessed                       Exercises Total Joint Exercises Quad Sets: Both;10 reps;Supine Heel Slides: AAROM;Left;10 reps Hip ABduction/ADduction: AAROM;Left;10 reps Straight Leg Raises: AROM;Left;Supine;10 reps    General Comments        Pertinent Vitals/Pain Pain Assessment: 0-10 Pain Score: 8  Pain Location: L knee Pain Descriptors / Indicators: Aching Pain Intervention(s): Patient requesting pain meds-RN notified;Monitored during session    Home Living Family/patient expects to be discharged to:: Private residence Living Arrangements: Children Available Help at Discharge: Family;Available 24 hours/day Type of Home: Apartment       Home Equipment: Walker - 2 wheels;Bedside commode      Prior Function Level of Independence: Independent with assistive device(s)          PT Goals (current goals can now be found in the care plan section) Acute Rehab PT Goals Patient Stated Goal:  (go home) Progress towards PT goals: Progressing toward goals    Frequency  7X/week    PT Plan Current plan remains appropriate    Co-evaluation             End of Session Equipment Utilized During Treatment: Gait belt Activity Tolerance: Patient tolerated treatment well Patient left: in bed;with call bell/phone within reach     Time: 1023-1052 PT Time Calculation (min): 29 min  Charges:  $Gait Training: 8-22 mins $Therapeutic Exercise: 8-22 mins  G Codes:      Fredrich Birks 09/06/2014, 11:47 AM 09/06/2014 Fredrich Birks PTA [phone] pager [phone] office

## 2014-09-06 NOTE — Progress Notes (Signed)
Physical Therapy Treatment Patient Details Name: Robin [name] MRN: [id number] DOB: 1966/04/28 Today's Date: 09/06/2014    History of Present Illness Patient is a 48 y/o female s/p left TKA 9/9. Hx of HTN, DM, GERD and obesity. Had R TKA in 06/2014. WBAT LLE.    PT Comments    Patient able to complete stair training this session. Patient safe to D/C from a mobility standpoint based on progression towards goals set on PT eval.    Follow Up Recommendations  Home health PT;Supervision/Assistance - 24 hour     Equipment Recommendations  None recommended by PT    Recommendations for Other Services       Precautions / Restrictions Precautions Precautions: Knee;Fall Restrictions RLE Weight Bearing: Weight bearing as tolerated    Mobility  Bed Mobility Overal bed mobility: Modified Independent Bed Mobility: Supine to Sit;Sit to Supine     Supine to sit: Supervision     General bed mobility comments:  Cues for positioning.   Transfers Overall transfer level: Modified independent Equipment used: Rolling walker (2 wheeled) Transfers: Sit to/from Stand Sit to Stand: Supervision         General transfer comment: Supervision for safety  Ambulation/Gait Ambulation/Gait assistance: Supervision Ambulation Distance (Feet): 75 Feet Assistive device: Rolling walker (2 wheeled) Gait Pattern/deviations: Step-to pattern;Decreased stance time - left;Decreased step length - right Gait velocity: Decreased   General Gait Details: Cues for upright posture and safe positioning of RW.    Stairs Stairs: Yes Stairs assistance: Min guard Stair Management: Backwards;Two rails;No rails Number of Stairs: 7 General stair comments: Patient cued for sequency and technique backwards with rails  Wheelchair Mobility    Modified Rankin (Stroke Patients Only)       Balance                                    Cognition Arousal/Alertness: Awake/alert Behavior During  Therapy: WFL for tasks assessed/performed Overall Cognitive Status: Within Functional Limits for tasks assessed                      Exercises Total Joint Exercises Quad Sets: Both;10 reps;Supine Heel Slides: AAROM;Left;10 reps Hip ABduction/ADduction: AAROM;Left;10 reps Straight Leg Raises: AROM;Left;Supine;10 reps    General Comments        Pertinent Vitals/Pain Pain Assessment: 0-10 Pain Score: 6  Pain Location: L knee Pain Descriptors / Indicators: Aching Pain Intervention(s): Monitored during session    Home Living                      Prior Function            PT Goals (current goals can now be found in the care plan section) Progress towards PT goals: Progressing toward goals    Frequency  7X/week    PT Plan Current plan remains appropriate    Co-evaluation             End of Session Equipment Utilized During Treatment: Gait belt Activity Tolerance: Patient tolerated treatment well Patient left: in bed;with call bell/phone within reach     Time: 1401-1425 PT Time Calculation (min): 24 min  Charges:  $Gait Training: 23-37 mins $Therapeutic Exercise: 8-22 mins                    G Codes:      Fredrich Birks 09/06/2014, 2:56 PM  09/06/2014 Fredrich Birks PTA [phone] pager [phone] office

## 2014-11-18 ENCOUNTER — Other Ambulatory Visit: Payer: Self-pay | Admitting: Orthopedic Surgery

## 2014-11-18 ENCOUNTER — Other Ambulatory Visit (HOSPITAL_COMMUNITY): Payer: Self-pay | Admitting: Cardiology

## 2014-11-18 ENCOUNTER — Ambulatory Visit (HOSPITAL_COMMUNITY): Payer: Medicaid Other | Attending: Orthopedic Surgery | Admitting: Cardiology

## 2014-11-18 DIAGNOSIS — M79605 Pain in left leg: Secondary | ICD-10-CM

## 2014-11-18 DIAGNOSIS — I10 Essential (primary) hypertension: Secondary | ICD-10-CM | POA: Insufficient documentation

## 2014-11-18 DIAGNOSIS — M7989 Other specified soft tissue disorders: Secondary | ICD-10-CM

## 2014-11-18 DIAGNOSIS — R05 Cough: Secondary | ICD-10-CM

## 2014-11-18 DIAGNOSIS — I82402 Acute embolism and thrombosis of unspecified deep veins of left lower extremity: Secondary | ICD-10-CM

## 2014-11-18 DIAGNOSIS — R059 Cough, unspecified: Secondary | ICD-10-CM

## 2014-11-18 NOTE — Progress Notes (Signed)
Left lower extremity venous duplex performed  Results given to Robin [name], patient sent back over to office.

## 2014-11-19 ENCOUNTER — Other Ambulatory Visit: Payer: Medicaid Other

## 2015-08-08 ENCOUNTER — Emergency Department (HOSPITAL_BASED_OUTPATIENT_CLINIC_OR_DEPARTMENT_OTHER)
Admission: EM | Admit: 2015-08-08 | Discharge: 2015-08-08 | Disposition: A | Discharge status: Home / Self Care | Payer: Medicaid Other | Attending: Emergency Medicine | Admitting: Emergency Medicine

## 2015-08-08 ENCOUNTER — Encounter (HOSPITAL_BASED_OUTPATIENT_CLINIC_OR_DEPARTMENT_OTHER): Payer: Self-pay | Admitting: *Deleted

## 2015-08-08 DIAGNOSIS — R739 Hyperglycemia, unspecified: Secondary | ICD-10-CM

## 2015-08-08 DIAGNOSIS — K219 Gastro-esophageal reflux disease without esophagitis: Secondary | ICD-10-CM | POA: Insufficient documentation

## 2015-08-08 DIAGNOSIS — R222 Localized swelling, mass and lump, trunk: Secondary | ICD-10-CM

## 2015-08-08 DIAGNOSIS — Z794 Long term (current) use of insulin: Secondary | ICD-10-CM | POA: Insufficient documentation

## 2015-08-08 DIAGNOSIS — E1165 Type 2 diabetes mellitus with hyperglycemia: Secondary | ICD-10-CM | POA: Insufficient documentation

## 2015-08-08 DIAGNOSIS — Z87891 Personal history of nicotine dependence: Secondary | ICD-10-CM | POA: Insufficient documentation

## 2015-08-08 DIAGNOSIS — R1900 Intra-abdominal and pelvic swelling, mass and lump, unspecified site: Secondary | ICD-10-CM | POA: Insufficient documentation

## 2015-08-08 DIAGNOSIS — M1990 Unspecified osteoarthritis, unspecified site: Secondary | ICD-10-CM | POA: Insufficient documentation

## 2015-08-08 DIAGNOSIS — I10 Essential (primary) hypertension: Secondary | ICD-10-CM | POA: Insufficient documentation

## 2015-08-08 DIAGNOSIS — Z7982 Long term (current) use of aspirin: Secondary | ICD-10-CM | POA: Insufficient documentation

## 2015-08-08 DIAGNOSIS — E669 Obesity, unspecified: Secondary | ICD-10-CM | POA: Insufficient documentation

## 2015-08-08 DIAGNOSIS — Z79899 Other long term (current) drug therapy: Secondary | ICD-10-CM | POA: Insufficient documentation

## 2015-08-08 LAB — URINALYSIS, ROUTINE W REFLEX MICROSCOPIC
Bilirubin Urine: NEGATIVE
Glucose, UA: >1000 mg/dL — AB
Hgb urine dipstick: NEGATIVE
KETONES UR: NEGATIVE mg/dL
Leukocytes, UA: NEGATIVE
NITRITE: NEGATIVE
Protein, ur: 100 mg/dL — AB
Specific Gravity, Urine: 1.041 — ABNORMAL HIGH (ref 1.005–1.030)
Urobilinogen, UA: 0.2 mg/dL (ref 0.0–1.0)
pH: 5 (ref 5.0–8.0)

## 2015-08-08 LAB — URINE MICROSCOPIC-ADD ON

## 2015-08-08 LAB — CBG MONITORING, ED: GLUCOSE-CAPILLARY: 246 mg/dL — AB (ref 65–99)

## 2015-08-08 NOTE — Discharge Instructions (Signed)
Try www.needymed.org for patient assistance with obtaining prescriptions. Return to the ed if you develop signs of infection such as heat, redness, redness, swelling, drainage from the sites of pain, fever, body aches. Follow-up with your physician as soon as possible.  Hyperglycemia Hyperglycemia occurs when the glucose (sugar) in your blood is too high. Hyperglycemia can happen for many reasons, but it most often happens to people who do not know they have diabetes or are not managing their diabetes properly.  CAUSES  Whether you have diabetes or not, there are other causes of hyperglycemia. Hyperglycemia can occur when you have diabetes, but it can also occur in other situations that you might not be as aware of, such as: Diabetes  If you have diabetes and are having problems controlling your blood glucose, hyperglycemia could occur because of some of the following reasons:  Not following your meal plan.  Not taking your diabetes medications or not taking it properly.  Exercising less or doing less activity than you normally do.  Being sick. Pre-diabetes  This cannot be ignored. Before people develop Type 2 diabetes, they almost always have "pre-diabetes." This is when your blood glucose levels are higher than normal, but not yet high enough to be diagnosed as diabetes. Research has shown that some long-term damage to the body, especially the heart and circulatory system, may already be occurring during pre-diabetes. If you take action to manage your blood glucose when you have pre-diabetes, you may delay or prevent Type 2 diabetes from developing. Stress  If you have diabetes, you may be "diet" controlled or on oral medications or insulin to control your diabetes. However, you may find that your blood glucose is higher than usual in the hospital whether you have diabetes or not. This is often referred to as "stress hyperglycemia." Stress can elevate your blood glucose. This happens because  of hormones put out by the body during times of stress. If stress has been the cause of your high blood glucose, it can be followed regularly by your caregiver. That way he/she can make sure your hyperglycemia does not continue to get worse or progress to diabetes. Steroids  Steroids are medications that act on the infection fighting system (immune system) to block inflammation or infection. One side effect can be a rise in blood glucose. Most people can produce enough extra insulin to allow for this rise, but for those who cannot, steroids make blood glucose levels go even higher. It is not unusual for steroid treatments to "uncover" diabetes that is developing. It is not always possible to determine if the hyperglycemia will go away after the steroids are stopped. A special blood test called an A1c is sometimes done to determine if your blood glucose was elevated before the steroids were started. SYMPTOMS  Thirsty.  Frequent urination.  Dry mouth.  Blurred vision.  Tired or fatigue.  Weakness.  Sleepy.  Tingling in feet or leg. DIAGNOSIS  Diagnosis is made by monitoring blood glucose in one or all of the following ways:  A1c test. This is a chemical found in your blood.  Fingerstick blood glucose monitoring.  Laboratory results. TREATMENT  First, knowing the cause of the hyperglycemia is important before the hyperglycemia can be treated. Treatment may include, but is not be limited to:  Education.  Change or adjustment in medications.  Change or adjustment in meal plan.  Treatment for an illness, infection, etc.  More frequent blood glucose monitoring.  Change in exercise plan.  Decreasing or  stopping steroids.  Lifestyle changes. HOME CARE INSTRUCTIONS   Test your blood glucose as directed.  Exercise regularly. Your caregiver will give you instructions about exercise. Pre-diabetes or diabetes which comes on with stress is helped by exercising.  Eat wholesome,  balanced meals. Eat often and at regular, fixed times. Your caregiver or nutritionist will give you a meal plan to guide your sugar intake.  Being at an ideal weight is important. If needed, losing as little as 10 to 15 pounds may help improve blood glucose levels. SEEK MEDICAL CARE IF:   You have questions about medicine, activity, or diet.  You continue to have symptoms (problems such as increased thirst, urination, or weight gain). SEEK IMMEDIATE MEDICAL CARE IF:   You are vomiting or have diarrhea.  Your breath smells fruity.  You are breathing faster or slower.  You are very sleepy or incoherent.  You have numbness, tingling, or pain in your feet or hands.  You have chest pain.  Your symptoms get worse even though you have been following your caregiver's orders.  If you have any other questions or concerns. Document Released: 06/08/2001 Document Revised: 03/06/2012 Document Reviewed: 04/10/2012 [facility] Patient Information 2015 [place], [place]. This information is not intended to replace advice given to you by your health care provider. Make sure you discuss any questions you have with your health care provider.

## 2015-08-08 NOTE — ED Provider Notes (Signed)
CSN: [id number]     Arrival date & time 08/08/15  1129 History   First MD Initiated Contact with Patient 08/08/15 1304     Chief Complaint  Patient presents with  . Abdominal Pain     (Consider location/radiation/quality/duration/timing/severity/associated sxs/prior Treatment) HPI Robin [name] This 49 year old female with a past medical history of obesity, hypertension, diabetes. Patient states she is unable to afford her Lantus currently but normally injects Lantus and her belly. Patient complains of 2 small nodules in the abdominal pannus fat which are tender to palpation. She denies any drainage, heat, redness, swelling. She's never had anything like this before. Patient denies fevers, chills, myalgias or other signs of systemic infection. She denies any injuries to the abdomen. Past Medical History  Diagnosis Date  . Hypertension   . Diabetes mellitus   . GERD (gastroesophageal reflux disease)   . Obesity   . Headache(784.0)     hx migraine  . Arthritis    Past Surgical History  Procedure Laterality Date  . Cholecystectomy    . Cesarean section    . Tubal ligation  1994  . Total knee arthroplasty Right 07/03/2014    Procedure: TOTAL KNEE ARTHROPLASTY;  Surgeon: Loreta Ave, MD;  Location: [facility];  Service: Orthopedics;  Laterality: Right;  . Eye surgery Bilateral     cataracts  . Total knee arthroplasty Left 09/04/2014    DR MURPHY  . Total knee arthroplasty Left 09/04/2014    Procedure: LEFT TOTAL KNEE ARTHROPLASTY;  Surgeon: Loreta Ave, MD;  Location: [facility] OR;  Service: Orthopedics;  Laterality: Left;   No family history on file. Social History  Substance Use Topics  . Smoking status: Former Smoker -- 0.50 packs/day for 9 years    Quit date: 12/27/2009  . Smokeless tobacco: Never Used  . Alcohol Use: No   OB History    No data available     Review of Systems  Ten systems reviewed and are negative for acute change, except as noted in the HPI.     Allergies  Pregabalin and Sulfa antibiotics  Home Medications   Prior to Admission medications   Medication Sig Start Date End Date Taking? Authorizing Provider  albuterol (PROVENTIL HFA;VENTOLIN HFA) 108 (90 BASE) MCG/ACT inhaler Inhale 2 puffs into the lungs every 6 (six) hours as needed for shortness of breath.     Historical Provider, MD  aspirin EC 325 MG tablet Take 1 tablet (325 mg total) by mouth daily. 09/04/14   Cristie Hem, PA-C  bisacodyl (DULCOLAX) 5 MG EC tablet Take 1 tablet (5 mg total) by mouth daily as needed for moderate constipation. 09/04/14   Cristie Hem, PA-C  HYDROmorphone (DILAUDID) 2 MG tablet Take 1 tablet (2 mg total) by mouth every 4 (four) hours as needed for severe pain. 09/04/14   Cristie Hem, PA-C  insulin glargine (LANTUS) 100 UNIT/ML injection Inject 50 Units into the skin 2 (two) times daily as needed (low blood sugar).    Historical Provider, MD  lisinopril-hydrochlorothiazide (PRINZIDE,ZESTORETIC) 20-12.5 MG per tablet Take 1 tablet by mouth daily.    Historical Provider, MD  methocarbamol (ROBAXIN) 500 MG tablet Take 1 tablet (500 mg total) by mouth 4 (four) times daily. 09/04/14   Cristie Hem, PA-C  omeprazole (PRILOSEC) 10 MG capsule Take 10 mg by mouth daily.    Historical Provider, MD  ondansetron (ZOFRAN) 4 MG tablet Take 1 tablet (4 mg total) by mouth every 8 (eight)  hours as needed for nausea or vomiting. 09/04/14   Cristie Hem, PA-C   BP 150/82 mmHg  Pulse 61  Temp(Src) 98 F (36.7 C) (Oral)  Resp 16  Ht  (1.651 m)  Wt 219 lb (99.338 kg)  BMI 36.44 kg/m2  SpO2 99%  LMP 04/25/2014 Physical Exam  Constitutional: She is oriented to person, place, and time. She appears well-developed and well-nourished. No distress.  HENT:  Head: Normocephalic and atraumatic.  Eyes: Conjunctivae are normal. No scleral icterus.  Neck: Normal range of motion.  Cardiovascular: Normal rate, regular rhythm and normal heart sounds.  Exam  reveals no gallop and no friction rub.   No murmur heard. Pulmonary/Chest: Effort normal and breath sounds normal. No respiratory distress.  Abdominal: Soft. Bowel sounds are normal. She exhibits no distension and no mass. There is no tenderness. There is no guarding.    Neurological: She is alert and oriented to person, place, and time.  Skin: Skin is warm and dry. She is not diaphoretic.  Nursing note and vitals reviewed.   ED Course  Procedures (including critical care time) Labs Review Labs Reviewed  URINALYSIS, ROUTINE W REFLEX MICROSCOPIC (NOT AT [facility]) - Abnormal; Notable for the following:    Specific Gravity, Urine 1.041 (*)    Glucose, UA >1000 (*)    Protein, ur 100 (*)    All other components within normal limits  URINE MICROSCOPIC-ADD ON - Abnormal; Notable for the following:    Squamous Epithelial / LPF FEW (*)    Casts GRANULAR CAST (*)    All other components within normal limits  CBG MONITORING, ED - Abnormal; Notable for the following:    Glucose-Capillary 246 (*)    All other components within normal limits    Imaging Review No results found. Lucila Maine, personally reviewed and evaluated these images and lab results as part of my medical decision-making.   EKG Interpretation None      MDM   Final diagnoses:  Abdominal wall lump  Hyperglycemia    Patient with abdominal wall nodules are tender to palpation. This may represent scar tissue from previous injection sites. I doubt any signs of an pending infection. Patient seen in shared visit with attending physician, who agrees with plan of care. Patient will be discharged. Over-the-counter analgesia for pain relief. Discussed return precautions with the patient. She appears safe for discharge at this time    Arthor Captain, PA-C 08/11/15 1648  Azalia Bilis, MD 08/12/15 [phone]

## 2015-08-08 NOTE — ED Notes (Signed)
Onset x 2weeks >>pain left groin  Denies urinary sx  Denies vaginal disc.  States she can feel knots in left lower abd

## 2015-08-08 NOTE — ED Notes (Signed)
States she has had a knot inside her lower abdomen x 2 weeks. Painful.

## 2015-09-02 IMAGING — CT CT ABD-PELV W/O CM
2 of 4 series · 17 of 46 positions shown, 19 images · non-contrast
Comparison: 09/08/2013

CLINICAL DATA: Left flank pain.

EXAM:
CT ABDOMEN AND PELVIS WITHOUT CONTRAST
TECHNIQUE: Multidetector CT imaging of the abdomen and pelvis was performed
following the standard protocol without intravenous contrast.

[Series 2: renal stone > 200 lbs 5.0 b31f · axial · 0.86mm/px · z∈[-468,-48]mm · 14 of 92 slices shown, 16 images]
[im 4/92  soft-tissue]
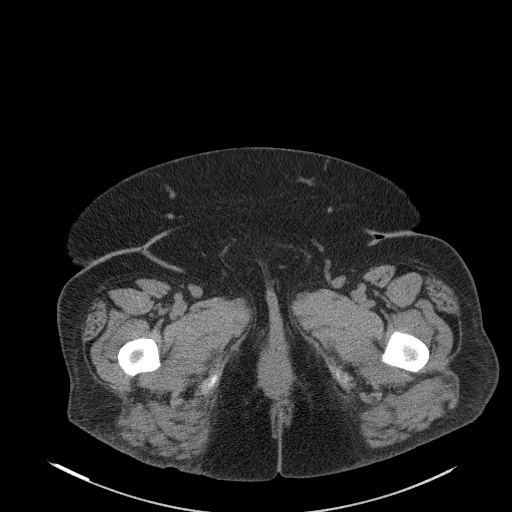
[im 4/92  bone]
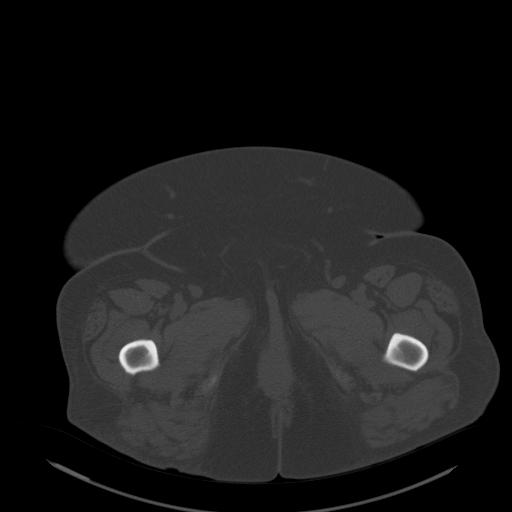
[im 12/92  soft-tissue]
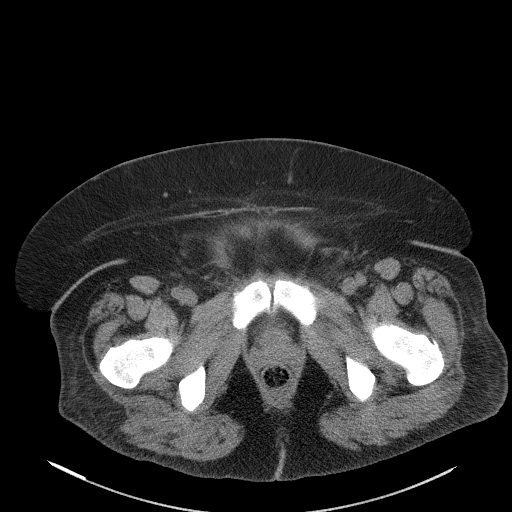
[im 19/92  soft-tissue]
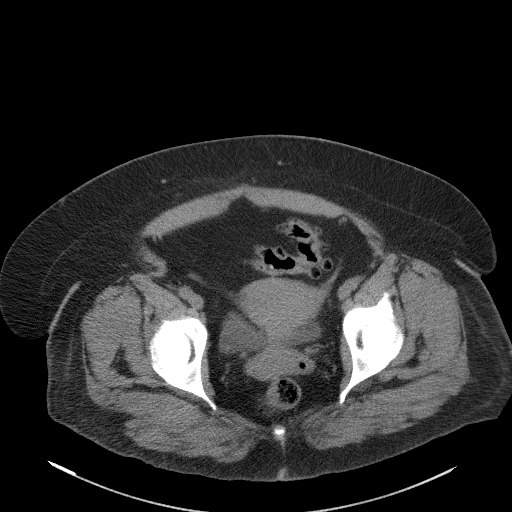
[im 23/92  soft-tissue]
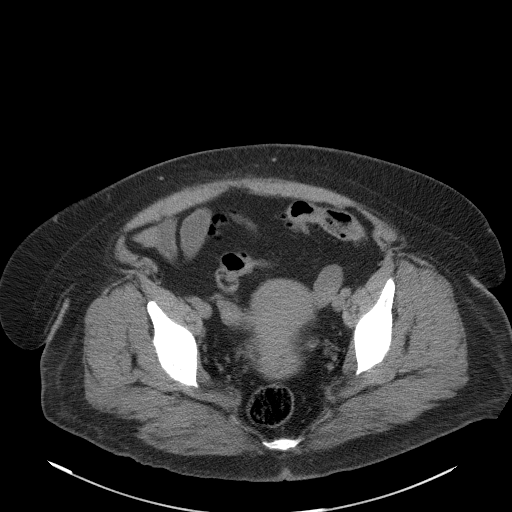
[im 31/92  soft-tissue]
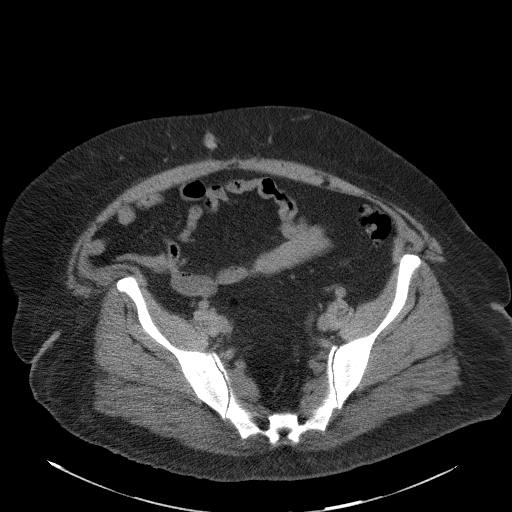
[im 38/92  soft-tissue]
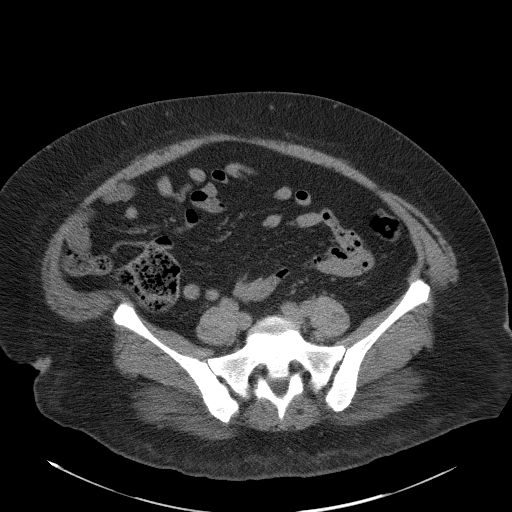
[im 42/92  soft-tissue]
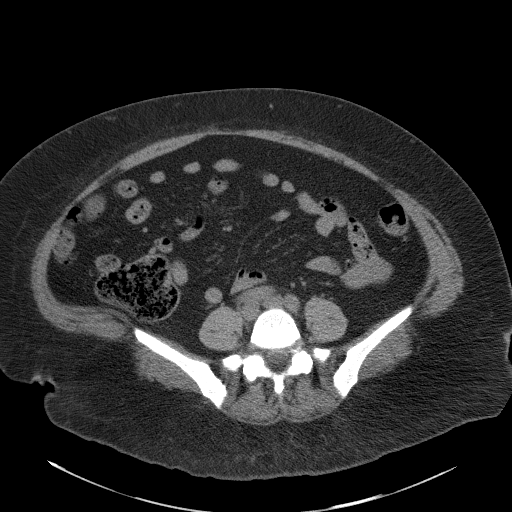
[im 50/92  soft-tissue]
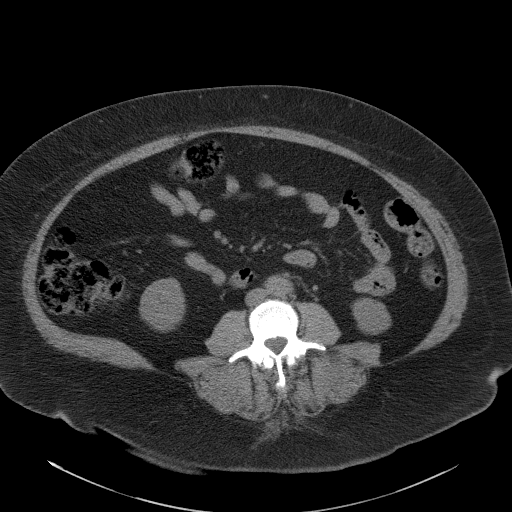
[im 54/92  soft-tissue]
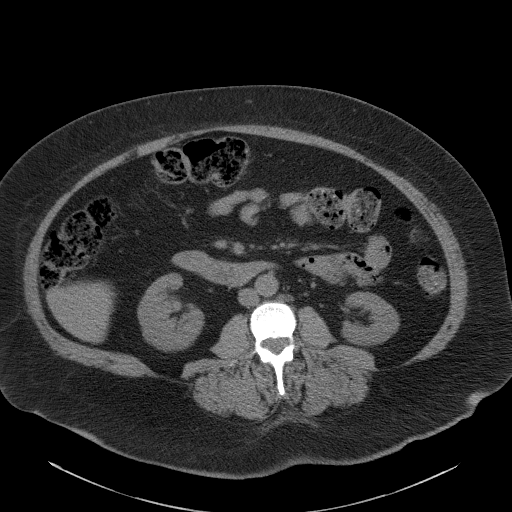
[im 54/92  bone]
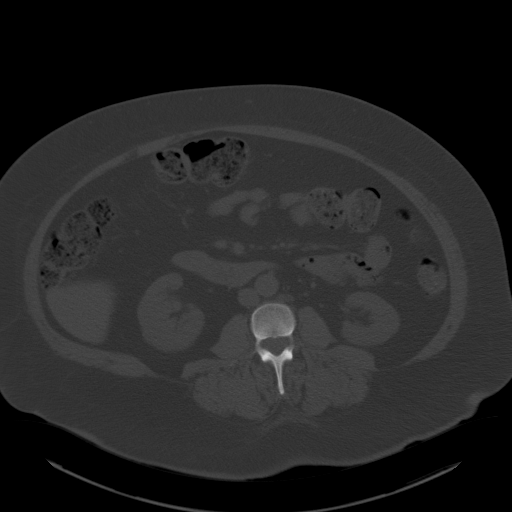
[im 61/92  soft-tissue]
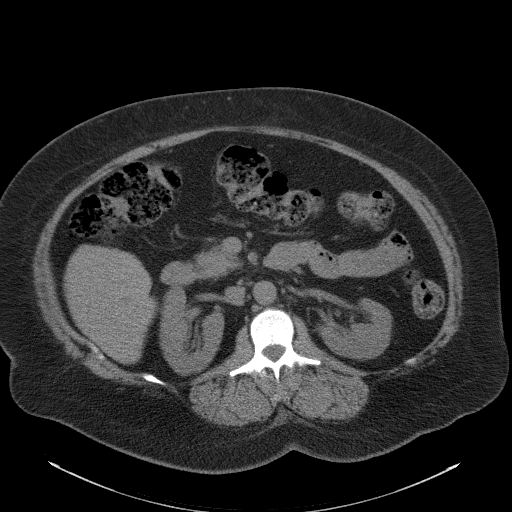
[im 69/92  soft-tissue]
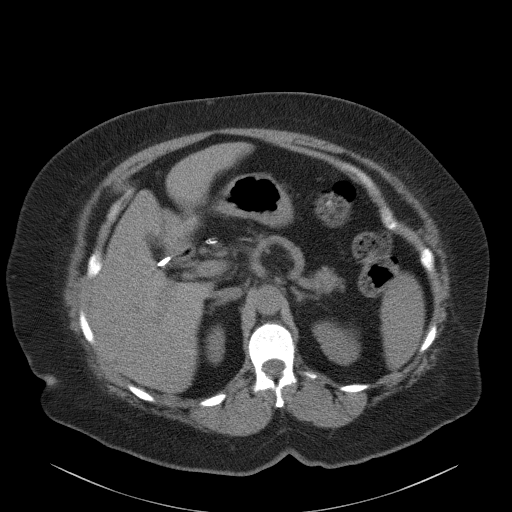
[im 73/92  soft-tissue]
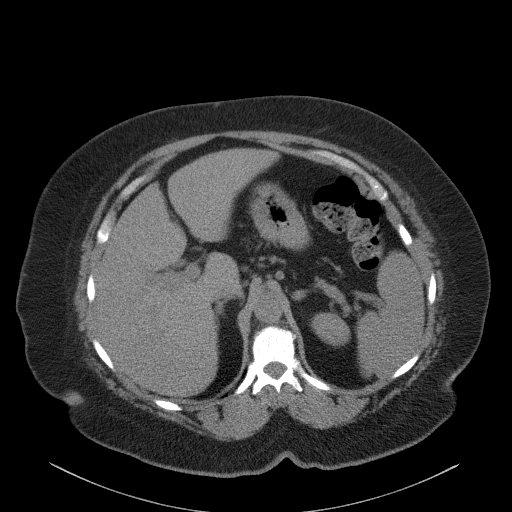
[im 80/92  soft-tissue]
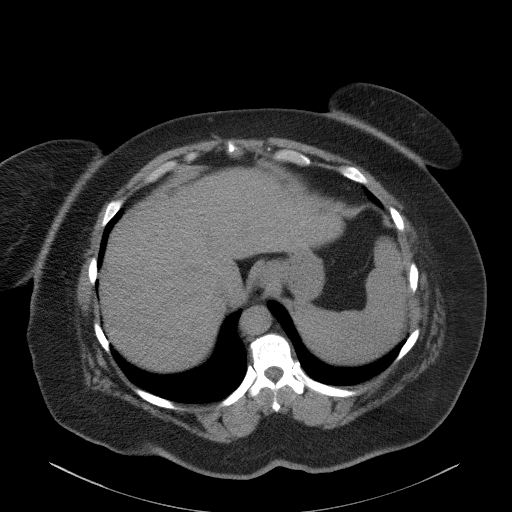
[im 88/92  soft-tissue]
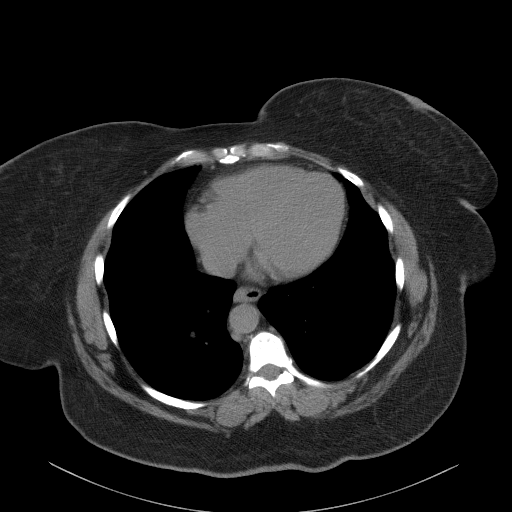

[Series 5: renal stone 3.0 coronal · coronal · 0.88mm/px · 3 of 112 slices shown]
[im 38/112  soft-tissue]
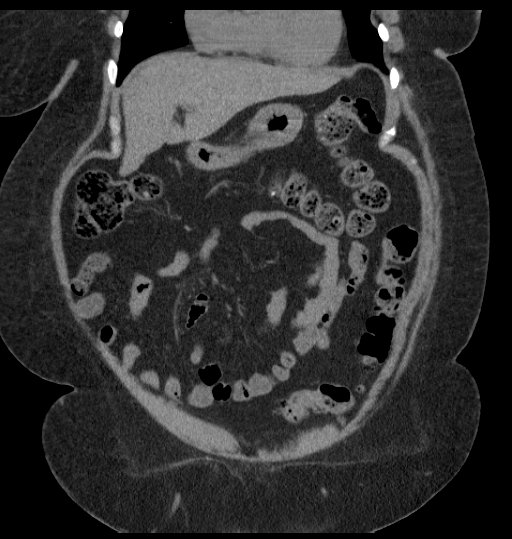
[im 50/112  soft-tissue]
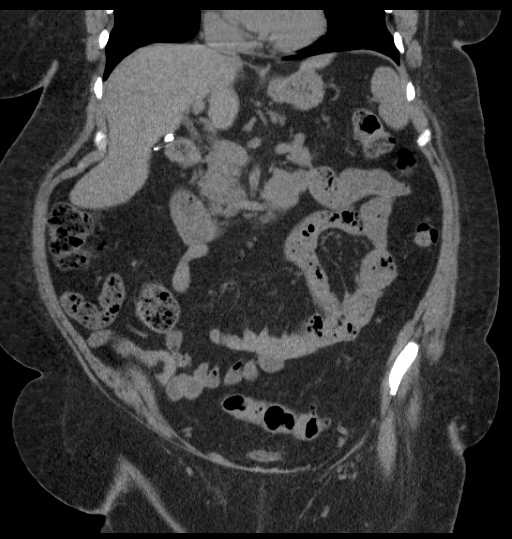
[im 62/112  soft-tissue]
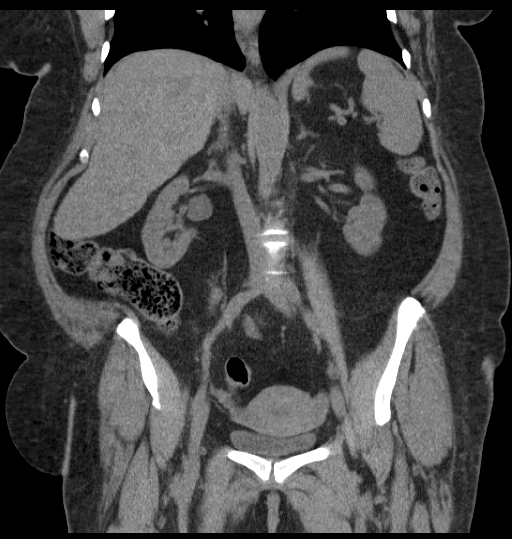

[17 of 46 positions shown; findings below may reference images not displayed]

FINDINGS: Subtle 2 mm nodule at the right lung base is nonspecific and could
be incidental. This subtle nodule is seen on sequence 4, image 10
and stable since 09/08/2013. No evidence for free air.

Negative for kidney stones or hydronephrosis. Again noted is a right
extrarenal pelvis. Normal appearance of the adrenal glands and the
gallbladder has been removed. No gross abnormality to the liver,
spleen or pancreas. Normal appearance of the stomach and bowel
structures. No gross abnormality to the uterus or adnexal structures
on this non-contrast examination. No significant free fluid or
lymphadenopathy.

Diverticulosis in the sigmoid colon without acute colonic
inflammation.

No acute bone abnormality.
IMPRESSION: No acute abnormalities within the abdomen or pelvis. No evidence for
kidney stones or hydronephrosis.

Colonic diverticulosis without acute colonic inflammation.

2 mm nodule at the right lung base. If the patient is at high risk
for bronchogenic carcinoma, follow-up chest CT at 1 year is
recommended. If the patient is at low risk, no follow-up is needed.
This recommendation follows the consensus statement: Guidelines for
Management of Small Pulmonary Nodules Detected on CT Scans: A
Statement from the [HOSPITAL] as published in Radiology

## 2016-08-02 ENCOUNTER — Encounter (HOSPITAL_BASED_OUTPATIENT_CLINIC_OR_DEPARTMENT_OTHER): Payer: Self-pay | Admitting: Emergency Medicine

## 2016-08-02 ENCOUNTER — Emergency Department (HOSPITAL_BASED_OUTPATIENT_CLINIC_OR_DEPARTMENT_OTHER)
Admission: EM | Admit: 2016-08-02 | Discharge: 2016-08-02 | Disposition: A | Discharge status: Home / Self Care | Payer: Medicaid Other | Attending: Emergency Medicine | Admitting: Emergency Medicine

## 2016-08-02 ENCOUNTER — Emergency Department (HOSPITAL_BASED_OUTPATIENT_CLINIC_OR_DEPARTMENT_OTHER): Payer: Medicaid Other

## 2016-08-02 DIAGNOSIS — Z7984 Long term (current) use of oral hypoglycemic drugs: Secondary | ICD-10-CM | POA: Insufficient documentation

## 2016-08-02 DIAGNOSIS — R112 Nausea with vomiting, unspecified: Secondary | ICD-10-CM | POA: Insufficient documentation

## 2016-08-02 DIAGNOSIS — Z87891 Personal history of nicotine dependence: Secondary | ICD-10-CM | POA: Insufficient documentation

## 2016-08-02 DIAGNOSIS — E119 Type 2 diabetes mellitus without complications: Secondary | ICD-10-CM | POA: Insufficient documentation

## 2016-08-02 DIAGNOSIS — I10 Essential (primary) hypertension: Secondary | ICD-10-CM | POA: Insufficient documentation

## 2016-08-02 DIAGNOSIS — R0789 Other chest pain: Secondary | ICD-10-CM

## 2016-08-02 LAB — CBC WITH DIFFERENTIAL/PLATELET
BASOS ABS: 0 10*3/uL (ref 0.0–0.1)
Basophils Relative: 0 %
EOS PCT: 1 %
Eosinophils Absolute: 0.1 10*3/uL (ref 0.0–0.7)
HCT: 42.1 % (ref 36.0–46.0)
Hemoglobin: 14.2 g/dL (ref 12.0–15.0)
Lymphocytes Relative: 24 %
Lymphs Abs: 2.3 10*3/uL (ref 0.7–4.0)
MCH: 29.6 pg (ref 26.0–34.0)
MCHC: 33.7 g/dL (ref 30.0–36.0)
MCV: 87.7 fL (ref 78.0–100.0)
Monocytes Absolute: 0.7 10*3/uL (ref 0.1–1.0)
Monocytes Relative: 7 %
Neutro Abs: 6.8 10*3/uL (ref 1.7–7.7)
Neutrophils Relative %: 68 %
PLATELETS: 182 10*3/uL (ref 150–400)
RBC: 4.8 MIL/uL (ref 3.87–5.11)
RDW: 13.3 % (ref 11.5–15.5)
WBC: 9.9 10*3/uL (ref 4.0–10.5)

## 2016-08-02 LAB — COMPREHENSIVE METABOLIC PANEL
ALT: 32 U/L (ref 14–54)
ANION GAP: 10 (ref 5–15)
AST: 40 U/L (ref 15–41)
Albumin: 3.6 g/dL (ref 3.5–5.0)
Alkaline Phosphatase: 109 U/L (ref 38–126)
BUN: 14 mg/dL (ref 6–20)
CHLORIDE: 94 mmol/L — AB (ref 101–111)
CO2: 28 mmol/L (ref 22–32)
Calcium: 9.4 mg/dL (ref 8.9–10.3)
Creatinine, Ser: 0.82 mg/dL (ref 0.44–1.00)
GFR calc Af Amer: >60 mL/min (ref >60)
Glucose, Bld: 451 mg/dL — ABNORMAL HIGH (ref 65–99)
POTASSIUM: 3.9 mmol/L (ref 3.5–5.1)
Sodium: 132 mmol/L — ABNORMAL LOW (ref 135–145)
Total Bilirubin: 0.6 mg/dL (ref 0.3–1.2)
Total Protein: 7.3 g/dL (ref 6.5–8.1)

## 2016-08-02 LAB — TROPONIN I

## 2016-08-02 MED ORDER — GI COCKTAIL ~~LOC~~
30.0000 mL | Freq: Once | ORAL | Status: AC
  Administered 2016-08-02: 30 mL via ORAL
  Filled 2016-08-02: qty 30

## 2016-08-02 NOTE — ED Triage Notes (Signed)
Patient reports that she wok up this am with left sided chest pain Reports dizziness, N/V and SOB with the chest pain

## 2016-08-02 NOTE — Discharge Instructions (Signed)
Return to the emergency department if you develop worsening pain, high fever, difficulty breathing, or other new and concerning symptoms.

## 2016-08-02 NOTE — ED Provider Notes (Signed)
[facility] Provider Note   CSN: [id number] Arrival date & time: 08/02/16  1330  First Provider Contact:  None       History   Chief Complaint Chief Complaint  Patient presents with  . Chest Pain    HPI Robin [name] is a 50 y.o. female.  Patient is a 50 year old female with history of arthritis, diabetes, GERD. She presents for evaluation of chest discomfort, nausea, and vomiting that started early this morning when she woke from sleep. She describes this sensation as a burning in the center of her chest. There is no associated shortness of breath or diaphoresis.  She has no prior cardiac history. She did have a normal stress test performed at [facility] approximately one year ago. She has had no recent exertional symptoms. Her risk factors are diabetes and family history.   The history is provided by the patient.  Chest Pain   This is a new problem. The current episode started 6 to 12 hours ago. The problem occurs constantly. The problem has not changed since onset.The pain is present in the substernal region. The pain is moderate. The quality of the pain is described as burning. The pain does not radiate. Associated symptoms include nausea and vomiting. Pertinent negatives include no back pain, no palpitations and no shortness of breath.    Past Medical History:  Diagnosis Date  . Arthritis   . Diabetes mellitus   . GERD (gastroesophageal reflux disease)   . Headache(784.0)    hx migraine  . Hypertension   . Obesity     Patient Active Problem List   Diagnosis Date Noted  . 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 CESAREAN SECTION    . CHOLECYSTECTOMY    . EYE SURGERY Bilateral    cataracts  . TOTAL KNEE ARTHROPLASTY Right 07/03/2014   Procedure: TOTAL KNEE ARTHROPLASTY;  Surgeon: Loreta Aveaniel F Murphy, MD;  [facility];  Service: Orthopedics;  Laterality: Right;  . TOTAL KNEE ARTHROPLASTY  Left 09/04/2014   DR MURPHY  . TOTAL KNEE ARTHROPLASTY Left 09/04/2014   Procedure: LEFT TOTAL KNEE ARTHROPLASTY;  Surgeon: Loreta Aveaniel F Murphy, MD;  [facility];  Service: Orthopedics;  Laterality: Left;  . TUBAL LIGATION  1994    OB History    No data available       Home Medications    Prior to Admission medications   Medication Sig Start Date End Date Taking? Authorizing Provider  metFORMIN (GLUCOPHAGE) 1000 MG tablet Take 1,000 mg by mouth 2 (two) times daily with a meal.   Yes Historical Provider, MD  albuterol (PROVENTIL HFA;VENTOLIN HFA) 108 (90 BASE) MCG/ACT inhaler Inhale 2 puffs into the lungs every 6 (six) hours as needed for shortness of breath.     Historical Provider, MD  aspirin EC 325 MG tablet Take 1 tablet (325 mg total) by mouth daily. 09/04/14   Cristie HemMary L Stanbery, PA-C  bisacodyl (DULCOLAX) 5 MG EC tablet Take 1 tablet (5 mg total) by mouth daily as needed for moderate constipation. 09/04/14   Cristie HemMary L Stanbery, PA-C  HYDROmorphone (DILAUDID) 2 MG tablet Take 1 tablet (2 mg total) by mouth every 4 (four) hours as needed for severe pain. 09/04/14   Cristie HemMary L Stanbery, PA-C  insulin glargine (LANTUS) 100 UNIT/ML injection Inject 50 Units into the skin 2 (two) times daily as needed (low blood sugar).    Historical Provider, MD  lisinopril-hydrochlorothiazide (PRINZIDE,ZESTORETIC) 20-12.5 MG per tablet Take 1 tablet by mouth daily.    Historical Provider, MD  methocarbamol (ROBAXIN) 500 MG tablet Take 1 tablet (500 mg total) by mouth 4 (four) times daily. 09/04/14   Cristie Hem, PA-C  omeprazole (PRILOSEC) 10 MG capsule Take 10 mg by mouth daily.    Historical Provider, MD  ondansetron (ZOFRAN) 4 MG tablet Take 1 tablet (4 mg total) by mouth every 8 (eight) hours as needed for nausea or vomiting. 09/04/14   Cristie Hem, PA-C    Family History History reviewed. No pertinent family history.  Social History Social History  Substance Use Topics  . Smoking status: Former Smoker     Packs/day: 0.50    Years: 9.00    Quit date: 12/27/2009  . Smokeless tobacco: Never Used  . Alcohol use No     Allergies   Pregabalin and Sulfa antibiotics   Review of Systems Review of Systems  Respiratory: Negative for shortness of breath.   Cardiovascular: Positive for chest pain. Negative for palpitations.  Gastrointestinal: Positive for nausea and vomiting.  Musculoskeletal: Negative for back pain.  All other systems reviewed and are negative.    Physical Exam Updated Vital Signs BP (!) 146/104 (BP Location: Right Arm)   Pulse 87   Temp 98.5 F (36.9 C) (Oral)   Resp 18   Ht 5\' 5"  (1.651 m)   Wt 218 lb (98.9 kg)   LMP 04/26/2014   SpO2 100%   BMI 36.28 kg/m   Physical Exam  Constitutional: She is oriented to person, place, and time. She appears well-developed and well-nourished. No distress.  HENT:  Head: Normocephalic and atraumatic.  Neck: Normal range of motion. Neck supple.  Cardiovascular: Normal rate and regular rhythm.  Exam reveals no gallop and no friction rub.   No murmur heard. Pulmonary/Chest: Effort normal. No respiratory distress. She has no wheezes. She has no rales. She exhibits tenderness.  There is tenderness to palpation of the anterior chest wall which she reports reproduces her symptoms.  Abdominal: Soft. Bowel sounds are normal. She exhibits no distension. There is no tenderness.  Musculoskeletal: Normal range of motion.  Neurological: She is alert and oriented to person, place, and time.  Skin: Skin is warm and dry. She is not diaphoretic.  Nursing note and vitals reviewed.    ED Treatments / Results  Labs (all labs ordered are listed, but only abnormal results are displayed) Labs Reviewed  COMPREHENSIVE METABOLIC PANEL - Abnormal; Notable for the following:       Result Value   Sodium 132 (*)    Chloride 94 (*)    Glucose, Bld 451 (*)    All other components within normal limits  CBC WITH DIFFERENTIAL/PLATELET  TROPONIN I     EKG  EKG Interpretation  Date/Time:  Monday August 02 2016 13:38:03 EDT Ventricular Rate:  95 PR Interval:  180 QRS Duration: 90 QT Interval:  380 QTC Calculation: 477 R Axis:   148 Text Interpretation:  Normal sinus rhythm Right axis deviation Possible Anterior infarct , age undetermined Abnormal ECG No significant change since 06/20/2014 Confirmed by Treonna Klee  MD, Delayla Hoffmaster ([id number]) on 08/02/2016 2:40:21 PM       Radiology Dg Chest 2 View  Result Date: 08/02/2016 CLINICAL DATA:  Mid chest pain and shortness of breath since this morning. EXAM: CHEST  2 VIEW COMPARISON:  None. FINDINGS: The heart size and mediastinal contours are within normal limits. Both lungs are clear.  The visualized skeletal structures are unremarkable. IMPRESSION: Stable.  No acute findings Electronically Signed   By: Kennith Center M.D.   On: 08/02/2016 14:26    Procedures Procedures (including critical care time)  Medications Ordered in ED Medications  gi cocktail (Maalox,Lidocaine,Donnatal) (not administered)     Initial Impression / Assessment and Plan / ED Course  I have reviewed the triage vital signs and the nursing notes.  Pertinent labs & imaging results that were available during my care of the patient were reviewed by me and considered in my medical decision making (see chart for details).  Clinical Course      Final Clinical Impressions(s) / ED Diagnoses   Final diagnoses:  None   Patient presents with complaints of chest discomfort after waking up this morning nauseated and having vomited. Her EKG and troponin are negative. She is also undergone a stress test within the past year that was unremarkable. Her symptoms are atypical for cardiac pain and cardiac workup is negative. He also achieved relief with a GI cocktail. I suspect her symptoms are GI in nature and believe she is appropriate for discharge. To follow-up with primary Dr. if symptoms persist.   New Prescriptions New  Prescriptions   No medications on file     Geoffery Lyons, MD 08/02/16 1526

## 2017-10-10 ENCOUNTER — Encounter (HOSPITAL_BASED_OUTPATIENT_CLINIC_OR_DEPARTMENT_OTHER): Payer: Self-pay | Admitting: *Deleted

## 2017-10-10 ENCOUNTER — Emergency Department (HOSPITAL_BASED_OUTPATIENT_CLINIC_OR_DEPARTMENT_OTHER)
Admission: EM | Admit: 2017-10-10 | Discharge: 2017-10-10 | Disposition: A | Discharge status: Home / Self Care | Payer: Self-pay | Attending: Emergency Medicine | Admitting: Emergency Medicine

## 2017-10-10 DIAGNOSIS — L03032 Cellulitis of left toe: Secondary | ICD-10-CM | POA: Insufficient documentation

## 2017-10-10 DIAGNOSIS — I10 Essential (primary) hypertension: Secondary | ICD-10-CM | POA: Insufficient documentation

## 2017-10-10 DIAGNOSIS — Z87891 Personal history of nicotine dependence: Secondary | ICD-10-CM | POA: Insufficient documentation

## 2017-10-10 DIAGNOSIS — E119 Type 2 diabetes mellitus without complications: Secondary | ICD-10-CM | POA: Insufficient documentation

## 2017-10-10 DIAGNOSIS — Z79899 Other long term (current) drug therapy: Secondary | ICD-10-CM | POA: Insufficient documentation

## 2017-10-10 LAB — CBG MONITORING, ED: Glucose-Capillary: 448 mg/dL — ABNORMAL HIGH (ref 65–99)

## 2017-10-10 MED ORDER — DOXYCYCLINE HYCLATE 100 MG PO TABS
100.0000 mg | ORAL_TABLET | Freq: Once | ORAL | Status: AC
  Administered 2017-10-10: 100 mg via ORAL
  Filled 2017-10-10: qty 1

## 2017-10-10 MED ORDER — DOXYCYCLINE HYCLATE 100 MG PO CAPS: 100.0000 mg | ORAL_CAPSULE | Freq: Two times a day (BID) | ORAL | 0 refills | Status: DC

## 2017-10-10 MED ORDER — METFORMIN HCL 1000 MG PO TABS: 1000.0000 mg | ORAL_TABLET | Freq: Two times a day (BID) | ORAL | 1 refills | Status: AC

## 2017-10-10 MED ORDER — INSULIN GLARGINE 100 UNIT/ML ~~LOC~~ SOLN: 50.0000 [IU] | Freq: Every day | SUBCUTANEOUS | 1 refills | Status: DC

## 2017-10-10 MED ORDER — ASPIRIN EC 81 MG PO TBEC: 81.0000 mg | DELAYED_RELEASE_TABLET | Freq: Every day | ORAL | 1 refills | Status: AC

## 2017-10-10 MED ORDER — LISINOPRIL-HYDROCHLOROTHIAZIDE 20-12.5 MG PO TABS: 1.0000 | ORAL_TABLET | Freq: Every day | ORAL | 1 refills | Status: AC

## 2017-10-10 NOTE — ED Provider Notes (Signed)
[facility] Provider Note   CSN: [id number] Arrival date & time: 10/10/17  4098     History   Chief Complaint Chief Complaint  Patient presents with  . Wound Infection    HPI Robin [name] is a 51 y.o. female.  HPI Patient is a 51 year old female who presents the emergency department with complaints of redness of her fourth toe on her left foot as well as redness of her first toe of her right foot.  She is a diabetic and has been off of her diabetes medications for the past 3 months secondary to inability to pay her monthly insurance payment.  She denies fevers and chills.  She reports redness of both toes.  She cuts her own toenails extremely short.  Her symptoms been present over the past several days.  She is currently without a primary care physician.  Past Medical History:  Diagnosis Date  . Arthritis   . Diabetes mellitus   . GERD (gastroesophageal reflux disease)   . Headache(784.0)    hx migraine  . Hypertension   . Obesity     Patient Active Problem List   Diagnosis Date Noted  . 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 CESAREAN SECTION    . CHOLECYSTECTOMY    . EYE SURGERY Bilateral    cataracts  . TOTAL KNEE ARTHROPLASTY Right 07/03/2014   Procedure: TOTAL KNEE ARTHROPLASTY;  Surgeon: Loreta Ave, MD;  Location: [facility] OR;  Service: Orthopedics;  Laterality: Right;  . TOTAL KNEE ARTHROPLASTY Left 09/04/2014   DR MURPHY  . TOTAL KNEE ARTHROPLASTY Left 09/04/2014   Procedure: LEFT TOTAL KNEE ARTHROPLASTY;  Surgeon: Loreta Ave, MD;  Location: [facility] OR;  Service: Orthopedics;  Laterality: Left;  . TUBAL LIGATION  1994    OB History    No data available       Home Medications    Prior to Admission medications   Medication Sig Start Date End Date Taking? Authorizing Provider  bisacodyl (DULCOLAX) 5 MG EC tablet Take 1 tablet (5 mg total) by mouth daily as needed for  moderate constipation. 09/04/14  Yes Cristie Hem, PA-C  omeprazole (PRILOSEC) 10 MG capsule Take 10 mg by mouth daily.   Yes [provider]  aspirin EC 81 MG tablet Take 1 tablet (81 mg total) by mouth daily. 10/10/17   Azalia Bilis, MD  insulin glargine (LANTUS) 100 UNIT/ML injection Inject 0.5 mLs (50 Units total) into the skin at bedtime. 10/10/17 01/08/18  Azalia Bilis, MD  lisinopril-hydrochlorothiazide (PRINZIDE,ZESTORETIC) 20-12.5 MG tablet Take 1 tablet by mouth daily. 10/10/17 01/08/18  Azalia Bilis, MD  metFORMIN (GLUCOPHAGE) 1000 MG tablet Take 1 tablet (1,000 mg total) by mouth 2 (two) times daily with a meal. 10/10/17 01/08/18  Azalia Bilis, MD    Family History No family history on file.  Social History Social History  Substance Use Topics  . Smoking status: Former Smoker    Packs/day: 0.50    Years: 9.00    Quit date: 12/27/2009  . Smokeless tobacco: Never Used  . Alcohol use No     Allergies   Pregabalin and Sulfa antibiotics   Review of Systems Review of Systems  All other systems reviewed and are negative.    Physical Exam Updated Vital Signs BP 128/85 (BP Location: Right Arm)   Pulse 66   Temp 98.2 F (36.8 C) (Oral)   Resp 16  Ht  (1.6 m)   Wt 89.4 kg (197 lb 1.5 oz)   LMP 06/20/2014   SpO2 98%   BMI 34.91 kg/m   Physical Exam  Constitutional: She is oriented to person, place, and time. She appears well-developed and well-nourished.  HENT:  Head: Normocephalic.  Eyes: EOM are normal.  Neck: Normal range of motion.  Cardiovascular: Normal rate.   Pulmonary/Chest: Effort normal.  Abdominal: Soft. She exhibits no distension. There is no tenderness.  Musculoskeletal: Normal range of motion.  Fourth toe on her left foot with mild erythema of the distal toe.  Normal perfusion of this toe.  Toenails cut short and slightly ingrown on the medial aspect without obvious paronychia.  No spreading erythema onto the left foot  Similar  appearance of her right great toe with erythema of the toe without extension onto the dorsum of the foot.  No evidence of abscess/paronychia.  Neurological: She is alert and oriented to person, place, and time.  Psychiatric: She has a normal mood and affect.  Nursing note and vitals reviewed.    ED Treatments / Results  Labs (all labs ordered are listed, but only abnormal results are displayed) Labs Reviewed  CBG MONITORING, ED - Abnormal; Notable for the following:       Result Value   Glucose-Capillary 448 (*)    All other components within normal limits  CBG MONITORING, ED    EKG  EKG Interpretation None       Radiology No results found.  Procedures Procedures (including critical care time)  Medications Ordered in ED Medications  doxycycline (VIBRA-TABS) tablet 100 mg (100 mg Oral Given 10/10/17 1610)     Initial Impression / Assessment and Plan / ED Course  I have reviewed the triage vital signs and the nursing notes.  Pertinent labs & imaging results that were available during my care of the patient were reviewed by me and considered in my medical decision making (see chart for details).     Cellulitis of 2 toes.  No evidence of spreading erythema.  Vital signs are stable.  Home with antibiotics and instructions to soak twice a day an antibacterial soap.  Podiatry follow-up.  Low cost primary care physician information provided to the patient.  She understands to return to the ER for new or worsening symptoms.  Diabetes medications written for.   Final Clinical Impressions(s) / ED Diagnoses   Final diagnoses:  Cellulitis of fourth toe, left    New Prescriptions Discharge Medication List as of 10/10/2017  8:40 AM       Azalia Bilis, MD 10/10/17 [phone]

## 2017-10-10 NOTE — ED Triage Notes (Signed)
Pt presents with toe wounds for several weeks (R great toe and L 4th toe with some bloody areas around other toenails). Pt reports she's diabetic and has been out of her meds for several months d/t cost. Denies fever.

## 2017-12-22 ENCOUNTER — Other Ambulatory Visit: Payer: Self-pay

## 2017-12-22 ENCOUNTER — Encounter (HOSPITAL_BASED_OUTPATIENT_CLINIC_OR_DEPARTMENT_OTHER): Payer: Self-pay

## 2017-12-22 ENCOUNTER — Emergency Department (HOSPITAL_BASED_OUTPATIENT_CLINIC_OR_DEPARTMENT_OTHER)
Admission: EM | Admit: 2017-12-22 | Discharge: 2017-12-23 | Disposition: A | Discharge status: Home / Self Care | Payer: Self-pay | Attending: Emergency Medicine | Admitting: Emergency Medicine

## 2017-12-22 DIAGNOSIS — Z9049 Acquired absence of other specified parts of digestive tract: Secondary | ICD-10-CM | POA: Insufficient documentation

## 2017-12-22 DIAGNOSIS — M79605 Pain in left leg: Secondary | ICD-10-CM | POA: Insufficient documentation

## 2017-12-22 DIAGNOSIS — Z87891 Personal history of nicotine dependence: Secondary | ICD-10-CM | POA: Insufficient documentation

## 2017-12-22 DIAGNOSIS — Z96653 Presence of artificial knee joint, bilateral: Secondary | ICD-10-CM | POA: Insufficient documentation

## 2017-12-22 DIAGNOSIS — E119 Type 2 diabetes mellitus without complications: Secondary | ICD-10-CM | POA: Insufficient documentation

## 2017-12-22 DIAGNOSIS — Z7982 Long term (current) use of aspirin: Secondary | ICD-10-CM | POA: Insufficient documentation

## 2017-12-22 DIAGNOSIS — Z79899 Other long term (current) drug therapy: Secondary | ICD-10-CM | POA: Insufficient documentation

## 2017-12-22 DIAGNOSIS — Z7984 Long term (current) use of oral hypoglycemic drugs: Secondary | ICD-10-CM | POA: Insufficient documentation

## 2017-12-22 DIAGNOSIS — I10 Essential (primary) hypertension: Secondary | ICD-10-CM | POA: Insufficient documentation

## 2017-12-22 HISTORY — DX: Sciatica, unspecified side: M54.30

## 2017-12-22 NOTE — ED Triage Notes (Signed)
C/o pain to left LE x "weeks"-denies known injury-was seen at [facility] 1 week ago-DVT r/o per pt rx ibuprofen-NAD-presents to triage in w/c-NAD

## 2017-12-22 NOTE — ED Notes (Signed)
Ice pack given per request

## 2017-12-23 MED ORDER — HYDROCODONE-ACETAMINOPHEN 5-325 MG PO TABS: 2.0000 | ORAL_TABLET | Freq: Four times a day (QID) | ORAL | 0 refills | Status: AC | PRN

## 2017-12-23 MED ORDER — CYCLOBENZAPRINE HCL 10 MG PO TABS
10.0000 mg | ORAL_TABLET | Freq: Once | ORAL | Status: AC
  Administered 2017-12-23: 10 mg via ORAL
  Filled 2017-12-23: qty 1

## 2017-12-23 MED ORDER — HYDROCODONE-ACETAMINOPHEN 5-325 MG PO TABS
1.0000 | ORAL_TABLET | Freq: Once | ORAL | Status: AC
  Administered 2017-12-23: 1 via ORAL
  Filled 2017-12-23: qty 1

## 2017-12-23 MED ORDER — CYCLOBENZAPRINE HCL 10 MG PO TABS: 10.0000 mg | ORAL_TABLET | Freq: Three times a day (TID) | ORAL | 0 refills | Status: AC | PRN

## 2017-12-23 NOTE — ED Notes (Signed)
Family at bedside. 

## 2017-12-23 NOTE — ED Provider Notes (Addendum)
[facility] Provider Note: Robin DellJ. Lane Jeanclaude Wentworth, MD, FACEP  CSN: [id number] MRN: [id number] ARRIVAL: 12/22/17 at 2111 ROOM: [facility]13/[facility]   CHIEF COMPLAINT  Leg Pain   HISTORY OF PRESENT ILLNESS  12/23/17 12:53 AM Robin [name] is a 51 y.o. female with about a 3-week history of pain in the back of her left calf and thigh.  Her daughter suspects she may have injured it while moving furniture around the house but the patient is not aware of any specific trauma. The pain has worsened over the past week to the point that she has not been able to work.  She rates it as a 10 out of 10.  The pain is worse with weightbearing, movement or palpation.  There is no associated swelling or deformity.  She had a Doppler ultrasound on the 17th which was negative for deep vein thrombosis.  She has been taking ibuprofen without relief.  [place] [facility] [place] reveals the patient has received no opioid prescriptions in the past year.   Past Medical History:  Diagnosis Date  . Arthritis   . Diabetes mellitus   . GERD (gastroesophageal reflux disease)   . Headache(784.0)    hx migraine  . Hypertension   . Obesity   . Sciatica     Past Surgical History:  Procedure Laterality Date  . CESAREAN SECTION    . CHOLECYSTECTOMY    . EYE SURGERY Bilateral    cataracts  . 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 TOTAL KNEE ARTHROPLASTY Left 09/04/2014   DR MURPHY  . TOTAL KNEE ARTHROPLASTY Left 09/04/2014   Procedure: LEFT TOTAL KNEE ARTHROPLASTY;  Surgeon: Loreta Aveaniel F Murphy, MD;  [facility];  Service: Orthopedics;  Laterality: Left;  . TUBAL LIGATION  1994    No family history on file.  Social History   Tobacco Use  . Smoking status: Former Smoker    Packs/day: 0.50    Years: 9.00    Pack years: 4.50   Last attempt to quit: 12/27/2009    Years since quitting: 7.9  . Smokeless tobacco: Never Used  Substance Use Topics  . Alcohol use: No  . Drug use: No    Prior to Admission medications   Medication Sig Start Date End Date Taking? Authorizing Provider  cyclobenzaprine (FLEXERIL) 10 MG tablet Take 10 mg by mouth 3 (three) times daily as needed for muscle spasms.   Yes [provider]  ibuprofen (ADVIL,MOTRIN) 800 MG tablet Take 800 mg by mouth every 8 (eight) hours as needed.   Yes [provider]  aspirin EC 81 MG tablet Take 1 tablet (81 mg total) by mouth daily. 10/10/17   Azalia Bilisampos, Kevin, MD  lisinopril-hydrochlorothiazide (PRINZIDE,ZESTORETIC) 20-12.5 MG tablet Take 1 tablet by mouth daily. 10/10/17 01/08/18  Azalia Bilisampos, Kevin, MD  metFORMIN (GLUCOPHAGE) 1000 MG tablet Take 1 tablet (1,000 mg total) by mouth 2 (two) times daily with a meal. 10/10/17 01/08/18  Azalia Bilisampos, Kevin, MD    Allergies Pregabalin and Sulfa antibiotics   REVIEW OF SYSTEMS  Negative except as noted here or in the History of Present Illness.   PHYSICAL EXAMINATION  Initial Vital Signs Blood pressure (!) 143/95, pulse 94, temperature 98 F (36.7 C), temperature source Oral, resp. rate 20, last menstrual period 06/20/2014, SpO2 98 %.  Examination General: Well-developed, well-nourished female in  no acute distress; appearance consistent with age of record HENT: normocephalic; atraumatic Eyes: Normal appearance Neck: supple Heart: regular rate and rhythm Lungs: clear to auscultation bilaterally Abdomen: soft; nondistended present Extremities: No deformity; pulses normal; tender musculature of left posterior thigh and calf with palpable spasm; decreased range of motion of left lower extremity due to pain Neurologic: Awake, alert and oriented; motor function intact in all extremities and symmetric; no facial droop Skin: Warm and dry Psychiatric: Tearful   RESULTS  Summary of this visit's results,  reviewed by myself:   EKG Interpretation  Date/Time:    Ventricular Rate:    PR Interval:    QRS Duration:   QT Interval:    QTC Calculation:   R Axis:     Text Interpretation:        Laboratory Studies: No results found for this or any previous visit (from the past 24 hour(s)). Imaging Studies: No results found.  ED COURSE  Nursing notes and initial vitals signs, including pulse oximetry, reviewed.  Vitals:   12/22/17 2121  BP: (!) 143/95  Pulse: 94  Resp: 20  Temp: 98 F (36.7 C)  TempSrc: Oral  SpO2: 98%    PROCEDURES    ED DIAGNOSES     ICD-10-CM   1. Left leg pain M79.605        Braedyn Riggle, Jonny RuizJohn, MD 12/23/17 0107    Paula LibraMolpus, Bevin Mayall, MD 12/23/17 [phone]

## 2023-06-21 ENCOUNTER — Encounter (HOSPITAL_COMMUNITY): Payer: Self-pay

## 2023-06-21 ENCOUNTER — Emergency Department (HOSPITAL_COMMUNITY): Payer: 59

## 2023-06-21 ENCOUNTER — Emergency Department (HOSPITAL_COMMUNITY)
Admission: EM | Admit: 2023-06-21 | Discharge: 2023-06-22 | Discharge status: Against Medical Advice | Payer: 59 | Attending: Emergency Medicine | Admitting: Emergency Medicine

## 2023-06-21 DIAGNOSIS — D649 Anemia, unspecified: Secondary | ICD-10-CM | POA: Diagnosis not present

## 2023-06-21 DIAGNOSIS — M7989 Other specified soft tissue disorders: Secondary | ICD-10-CM | POA: Insufficient documentation

## 2023-06-21 DIAGNOSIS — Z5321 Procedure and treatment not carried out due to patient leaving prior to being seen by health care provider: Secondary | ICD-10-CM | POA: Diagnosis not present

## 2023-06-21 DIAGNOSIS — R0602 Shortness of breath: Secondary | ICD-10-CM | POA: Diagnosis present

## 2023-06-21 LAB — COMPREHENSIVE METABOLIC PANEL
ALT: 12 U/L (ref 0–44)
AST: 20 U/L (ref 15–41)
Albumin: 2.4 g/dL — ABNORMAL LOW (ref 3.5–5.0)
Alkaline Phosphatase: 74 U/L (ref 38–126)
Anion gap: 7 (ref 5–15)
BUN: 38 mg/dL — ABNORMAL HIGH (ref 6–20)
CO2: 17 mmol/L — ABNORMAL LOW (ref 22–32)
Calcium: 8.3 mg/dL — ABNORMAL LOW (ref 8.9–10.3)
Chloride: 113 mmol/L — ABNORMAL HIGH (ref 98–111)
Creatinine, Ser: 3.16 mg/dL — ABNORMAL HIGH (ref 0.44–1.00)
GFR, Estimated: 17 mL/min — ABNORMAL LOW (ref >60)
Glucose, Bld: 133 mg/dL — ABNORMAL HIGH (ref 70–99)
Potassium: 4.8 mmol/L (ref 3.5–5.1)
Sodium: 137 mmol/L (ref 135–145)
Total Bilirubin: 0.4 mg/dL (ref 0.3–1.2)
Total Protein: 5.6 g/dL — ABNORMAL LOW (ref 6.5–8.1)

## 2023-06-21 LAB — CBC
HCT: 26.6 % — ABNORMAL LOW (ref 36.0–46.0)
Hemoglobin: 8.2 g/dL — ABNORMAL LOW (ref 12.0–15.0)
MCH: 29.8 pg (ref 26.0–34.0)
MCHC: 30.8 g/dL (ref 30.0–36.0)
MCV: 96.7 fL (ref 80.0–100.0)
Platelets: 205 10*3/uL (ref 150–400)
RBC: 2.75 MIL/uL — ABNORMAL LOW (ref 3.87–5.11)
RDW: 14.6 % (ref 11.5–15.5)
WBC: 8.2 10*3/uL (ref 4.0–10.5)
nRBC: 0 % (ref 0.0–0.2)

## 2023-06-21 NOTE — ED Triage Notes (Signed)
Pt is coming in from pcp for low hgb of 7.9 if you review the hematology notes it describes a downwardly trending hgb over the past year that usually sits around 8.2. Pt has some shortness of breath, leg swelling with weeping. No dialysis but is in consultation with a nephrologist. States she also generally feels weak and unwell.

## 2023-06-22 NOTE — ED Notes (Signed)
NA x4 for vitals ?
